# Patient Record
Sex: Male | Born: 1935 | Race: White | Hispanic: No | Marital: Single | Smoking: Former smoker
Health system: Southern US, Community
[De-identification: ages and names within clinical notes are randomized; demographics above are authoritative.]

## PROBLEM LIST (undated history)

## (undated) DIAGNOSIS — I10 Essential (primary) hypertension: Secondary | ICD-10-CM

## (undated) DIAGNOSIS — H3530 Unspecified macular degeneration: Secondary | ICD-10-CM

## (undated) HISTORY — PX: COLON SURGERY: SHX602

---

## 2006-08-01 ENCOUNTER — Emergency Department (HOSPITAL_COMMUNITY): Admission: EM | Admit: 2006-08-01 | Discharge: 2006-08-01 | Payer: Self-pay | Admitting: Emergency Medicine

## 2008-07-17 ENCOUNTER — Ambulatory Visit: Payer: Self-pay | Admitting: Internal Medicine

## 2009-01-08 ENCOUNTER — Ambulatory Visit: Payer: Self-pay | Admitting: Internal Medicine

## 2010-05-27 ENCOUNTER — Ambulatory Visit: Payer: Self-pay | Admitting: Internal Medicine

## 2011-03-04 ENCOUNTER — Other Ambulatory Visit: Payer: Self-pay

## 2011-03-04 MED ORDER — LISINOPRIL 20 MG PO TABS: 20.0000 mg | ORAL_TABLET | Freq: Every day | ORAL | Status: DC

## 2011-04-19 ENCOUNTER — Other Ambulatory Visit: Payer: Self-pay | Admitting: Internal Medicine

## 2011-05-12 ENCOUNTER — Ambulatory Visit (INDEPENDENT_AMBULATORY_CARE_PROVIDER_SITE_OTHER): Payer: Medicare Other | Admitting: Internal Medicine

## 2011-05-12 DIAGNOSIS — Z23 Encounter for immunization: Secondary | ICD-10-CM

## 2011-05-22 ENCOUNTER — Other Ambulatory Visit: Payer: Self-pay | Admitting: Internal Medicine

## 2011-06-21 ENCOUNTER — Other Ambulatory Visit: Payer: Self-pay | Admitting: Internal Medicine

## 2011-06-22 ENCOUNTER — Other Ambulatory Visit: Payer: Self-pay | Admitting: Internal Medicine

## 2011-06-23 ENCOUNTER — Other Ambulatory Visit: Payer: Self-pay | Admitting: Internal Medicine

## 2011-06-26 ENCOUNTER — Other Ambulatory Visit: Payer: Self-pay | Admitting: Internal Medicine

## 2011-07-21 ENCOUNTER — Other Ambulatory Visit: Payer: Self-pay | Admitting: Internal Medicine

## 2011-08-20 ENCOUNTER — Other Ambulatory Visit: Payer: Self-pay | Admitting: Internal Medicine

## 2011-08-28 ENCOUNTER — Other Ambulatory Visit: Payer: Self-pay | Admitting: Internal Medicine

## 2011-09-20 ENCOUNTER — Other Ambulatory Visit: Payer: Self-pay | Admitting: Internal Medicine

## 2011-09-20 NOTE — Telephone Encounter (Signed)
Is he due for OV/PE anytime soon?

## 2011-10-07 ENCOUNTER — Other Ambulatory Visit: Payer: Self-pay | Admitting: Internal Medicine

## 2011-10-07 NOTE — Telephone Encounter (Signed)
Please refill and see when appt is due.

## 2011-10-23 ENCOUNTER — Other Ambulatory Visit: Payer: Self-pay | Admitting: Internal Medicine

## 2011-11-14 ENCOUNTER — Other Ambulatory Visit: Payer: Self-pay | Admitting: Internal Medicine

## 2011-12-13 ENCOUNTER — Emergency Department (HOSPITAL_COMMUNITY): Payer: Medicare Other

## 2011-12-13 ENCOUNTER — Encounter (HOSPITAL_COMMUNITY): Payer: Self-pay | Admitting: Emergency Medicine

## 2011-12-13 ENCOUNTER — Emergency Department (HOSPITAL_COMMUNITY)
Admission: EM | Admit: 2011-12-13 | Discharge: 2011-12-13 | Disposition: A | Discharge status: Home / Self Care | Payer: Medicare Other | Attending: Emergency Medicine | Admitting: Emergency Medicine

## 2011-12-13 DIAGNOSIS — Z79899 Other long term (current) drug therapy: Secondary | ICD-10-CM | POA: Insufficient documentation

## 2011-12-13 DIAGNOSIS — I10 Essential (primary) hypertension: Secondary | ICD-10-CM | POA: Insufficient documentation

## 2011-12-13 DIAGNOSIS — R112 Nausea with vomiting, unspecified: Secondary | ICD-10-CM | POA: Insufficient documentation

## 2011-12-13 DIAGNOSIS — R5381 Other malaise: Secondary | ICD-10-CM | POA: Insufficient documentation

## 2011-12-13 DIAGNOSIS — R42 Dizziness and giddiness: Secondary | ICD-10-CM | POA: Insufficient documentation

## 2011-12-13 DIAGNOSIS — Z7982 Long term (current) use of aspirin: Secondary | ICD-10-CM | POA: Insufficient documentation

## 2011-12-13 HISTORY — DX: Essential (primary) hypertension: I10

## 2011-12-13 HISTORY — DX: Unspecified macular degeneration: H35.30

## 2011-12-13 LAB — POCT I-STAT, CHEM 8
Creatinine, Ser: 1.2 mg/dL (ref 0.50–1.35)
Glucose, Bld: 110 mg/dL — ABNORMAL HIGH (ref 70–99)
Hemoglobin: 13.6 g/dL (ref 13.0–17.0)
Sodium: 140 mEq/L (ref 135–145)
TCO2: 21 mmol/L (ref 0–100)

## 2011-12-13 MED ORDER — SODIUM CHLORIDE 0.9 % IV BOLUS (SEPSIS)
1000.0000 mL | Freq: Once | INTRAVENOUS | Status: AC
  Administered 2011-12-13: 1000 mL via INTRAVENOUS

## 2011-12-13 MED ORDER — MECLIZINE HCL 25 MG PO TABS: 25.0000 mg | ORAL_TABLET | Freq: Three times a day (TID) | ORAL | Status: AC | PRN

## 2011-12-13 MED ORDER — MECLIZINE HCL 25 MG PO TABS
25.0000 mg | ORAL_TABLET | Freq: Once | ORAL | Status: AC
  Administered 2011-12-13: 25 mg via ORAL
  Filled 2011-12-13: qty 1

## 2011-12-13 NOTE — ED Provider Notes (Signed)
History     CSN: [id number]  Arrival date & time 12/13/11  1705   First MD Initiated Contact with Patient 12/13/11 1742      Chief Complaint  Patient presents with  . Dizziness    The history is provided by the patient.   the patient reports he woke up approximately 1 AM with dizziness and then slept the remainder of the day when he awoke he was still dizzy.  At one point he believes it felt like there was spinning round and round but at other times he felt like he had more of a balance issue.  He became nauseated and vomited one time.  He's had no decrease oral intake.  He denies melena and hematochezia.  He's had no chest pain or palpitations.  He denies abdominal pain.  He has no headache at this time.  He denies weakness of his upper lower extremities.  Family reports no facial droop.  Family reports baseline mental status.  The patient has never had symptoms like this before.  Patient reports no history of stroke.  Past Medical History  Diagnosis Date  . Hypertension   . Macular degeneration of both eyes     Past Surgical History  Procedure Date  . Colon surgery     No family history on file.  History  Substance Use Topics  . Smoking status: Unknown If Ever Smoked  . Smokeless tobacco: Not on file  . Alcohol Use: No      Review of Systems  All other systems reviewed and are negative.    Allergies  Review of patient's allergies indicates no known allergies.  Home Medications   Current Outpatient Rx  Name Route Sig Dispense Refill  . AMLODIPINE BESYLATE 5 MG PO TABS  TAKE 1 TABLET BY MOUTH EVERY DAY 30 tablet 0  . ASPIRIN EC 81 MG PO TBEC Oral Take 81 mg by mouth daily.    Marland Kitchen LISINOPRIL 20 MG PO TABS  TAKE 1 TABLET BY MOUTH EVERY DAY 30 tablet 0  . MECLIZINE HCL 25 MG PO TABS Oral Take 1 tablet (25 mg total) by mouth 3 (three) times daily as needed. 30 tablet 0    BP 143/77  Pulse 75  Temp(Src) 98.1 F (36.7 C) (Oral)  Resp 16  SpO2 99%  Physical Exam    Nursing note and vitals reviewed. Constitutional: He is oriented to person, place, and time. He appears well-developed and well-nourished.  HENT:  Head: Normocephalic and atraumatic.  Eyes: EOM are normal. Pupils are equal, round, and reactive to light.  Neck: Normal range of motion.  Cardiovascular: Normal rate, regular rhythm, normal heart sounds and intact distal pulses.   Pulmonary/Chest: Effort normal and breath sounds normal. No respiratory distress.  Abdominal: Soft. He exhibits no distension. There is no tenderness.  Musculoskeletal: Normal range of motion.  Neurological: He is alert and oriented to person, place, and time.       5/5 strength in major muscle groups of  bilateral upper and lower extremities. Speech normal. No facial asymetry.  Normal finger to nose bilaterally.  Gait not tested  Skin: Skin is warm and dry.  Psychiatric: He has a normal mood and affect. Judgment normal.    ED Course  Procedures (including critical care time)  Labs Reviewed  POCT I-STAT, CHEM 8 - Abnormal; Notable for the following:    Glucose, Bld 110 (*)    All other components within normal limits   Mr Brain Wo  Contrast  12/13/2011  *RADIOLOGY REPORT*  Clinical Data: Hypertension with dizziness and weakness and one episode of vomiting.  MRI HEAD WITHOUT CONTRAST  Technique:  Multiplanar, multiecho pulse sequences of the brain and surrounding structures were obtained according to standard protocol without intravenous contrast.  Comparison: None.  Findings: No acute infarct.  Hemorrhagic breakdown products probably related to remote infarct left lenticular nucleus/caudate.  Tiny area of blood breakdown products cerebellum more notable on the right suggestive of result of prior hemorrhagic ischemia.  Moderate to small vessel disease type changes.  Global atrophy without hydrocephalus.  1.3 x 1 x 0.9 cm bilobed right-sided pituitary mass with suprasellar extension causing slight impression upon the  undersurface of the right optic nerve.  No other intracranial mass lesion noted on this unenhanced exam.  Mild spinal stenosis of the cervical spine.  Mild transverse ligament hypertrophy.  Major intracranial vascular structures are patent.  IMPRESSION: No acute infarct.  Hemorrhagic breakdown products probably related to remote infarct left lenticular nucleus/caudate.  Tiny area of blood breakdown products cerebellum more notable on the right suggestive of result of prior hemorrhagic ischemia.  Moderate to small vessel disease type changes.  Global atrophy without hydrocephalus.  1.3 x 1 x 0.9 cm bilobed right-sided pituitary mass with suprasellar extension causing slight impression upon the undersurface of the right optic nerve.  Original Report Authenticated By: Fuller Canada, M.D.     1. Dizziness       MDM  His MRI demonstrates no evidence of acute infarct.  Use argon 81 mg aspirin.  Likely had strokes before in the past.  This may represent peripheral vertigo for which I prescribed Antivert.  The patient feels much better at this time.  UA discharged home in good condition with PCP followup.        Lyanne Co, MD 12/13/11 [phone]

## 2011-12-13 NOTE — ED Notes (Signed)
Pt. Just return from [facility] was not performed

## 2011-12-13 NOTE — ED Notes (Signed)
Lab called to say that blood had never been received.

## 2011-12-13 NOTE — ED Notes (Signed)
Per EMS, woke up at 1 am with dizziness-took a nap this afternoon and same thing happened-nauseated with one vomiting episode on route to hospital

## 2011-12-13 NOTE — Discharge Instructions (Signed)

## 2011-12-13 NOTE — ED Notes (Signed)
Patient transported to CT unable to complete EKG at this time

## 2011-12-13 NOTE — ED Notes (Signed)
Off floor for testing 

## 2011-12-13 NOTE — ED Notes (Signed)
[id number]<AV> Expected date:<BR> Expected time: 4:54 PM<BR> Means of arrival:<BR> Comments:<BR> M10 - 87yoM dizzy

## 2011-12-20 ENCOUNTER — Other Ambulatory Visit: Payer: Self-pay | Admitting: Internal Medicine

## 2011-12-20 NOTE — Telephone Encounter (Signed)
Needs OV.  

## 2012-01-23 ENCOUNTER — Other Ambulatory Visit: Payer: Medicare Other | Admitting: Internal Medicine

## 2012-01-24 ENCOUNTER — Encounter: Payer: Medicare Other | Admitting: Internal Medicine

## 2012-01-25 ENCOUNTER — Other Ambulatory Visit: Payer: Self-pay | Admitting: Internal Medicine

## 2012-02-06 ENCOUNTER — Other Ambulatory Visit: Payer: Self-pay | Admitting: Internal Medicine

## 2012-03-05 ENCOUNTER — Other Ambulatory Visit: Payer: Medicare Other | Admitting: Internal Medicine

## 2012-03-06 ENCOUNTER — Encounter: Payer: Medicare Other | Admitting: Internal Medicine

## 2012-03-08 ENCOUNTER — Other Ambulatory Visit: Payer: Self-pay | Admitting: Internal Medicine

## 2012-04-08 ENCOUNTER — Other Ambulatory Visit: Payer: Self-pay | Admitting: Internal Medicine

## 2012-05-01 ENCOUNTER — Other Ambulatory Visit: Payer: Medicare Other | Admitting: Internal Medicine

## 2012-05-03 ENCOUNTER — Encounter: Payer: Medicare Other | Admitting: Internal Medicine

## 2012-05-16 ENCOUNTER — Other Ambulatory Visit: Payer: Self-pay | Admitting: Internal Medicine

## 2012-06-07 ENCOUNTER — Other Ambulatory Visit: Payer: Self-pay | Admitting: Internal Medicine

## 2012-06-21 ENCOUNTER — Other Ambulatory Visit: Payer: Medicare Other | Admitting: Internal Medicine

## 2012-06-21 ENCOUNTER — Ambulatory Visit (INDEPENDENT_AMBULATORY_CARE_PROVIDER_SITE_OTHER): Payer: Medicare Other | Admitting: Internal Medicine

## 2012-06-21 ENCOUNTER — Encounter: Payer: Self-pay | Admitting: Internal Medicine

## 2012-06-21 VITALS — BP 126/78 | HR 92 | Temp 97.7°F | Ht 67.5 in | Wt 155.0 lb

## 2012-06-21 DIAGNOSIS — Z0000 Encounter for general adult medical examination without abnormal findings: Secondary | ICD-10-CM

## 2012-06-21 DIAGNOSIS — I10 Essential (primary) hypertension: Secondary | ICD-10-CM

## 2012-06-21 DIAGNOSIS — H3530 Unspecified macular degeneration: Secondary | ICD-10-CM

## 2012-06-21 DIAGNOSIS — E237 Disorder of pituitary gland, unspecified: Secondary | ICD-10-CM

## 2012-06-21 DIAGNOSIS — M1712 Unilateral primary osteoarthritis, left knee: Secondary | ICD-10-CM

## 2012-06-21 DIAGNOSIS — M1990 Unspecified osteoarthritis, unspecified site: Secondary | ICD-10-CM

## 2012-06-21 DIAGNOSIS — I421 Obstructive hypertrophic cardiomyopathy: Secondary | ICD-10-CM

## 2012-06-21 DIAGNOSIS — H409 Unspecified glaucoma: Secondary | ICD-10-CM

## 2012-06-21 DIAGNOSIS — M4802 Spinal stenosis, cervical region: Secondary | ICD-10-CM

## 2012-06-21 DIAGNOSIS — Z23 Encounter for immunization: Secondary | ICD-10-CM

## 2012-06-21 DIAGNOSIS — C4440 Unspecified malignant neoplasm of skin of scalp and neck: Secondary | ICD-10-CM

## 2012-06-21 DIAGNOSIS — Z125 Encounter for screening for malignant neoplasm of prostate: Secondary | ICD-10-CM

## 2012-06-21 DIAGNOSIS — M1710 Unilateral primary osteoarthritis, unspecified knee: Secondary | ICD-10-CM

## 2012-06-21 DIAGNOSIS — E236 Other disorders of pituitary gland: Secondary | ICD-10-CM

## 2012-06-21 LAB — COMPREHENSIVE METABOLIC PANEL
ALT: 11 U/L (ref 0–53)
AST: 13 U/L (ref 0–37)
Alkaline Phosphatase: 62 U/L (ref 39–117)
Glucose, Bld: 86 mg/dL (ref 70–99)
Sodium: 139 mEq/L (ref 135–145)
Total Bilirubin: 1.2 mg/dL (ref 0.3–1.2)
Total Protein: 6.7 g/dL (ref 6.0–8.3)

## 2012-06-21 LAB — CBC WITH DIFFERENTIAL/PLATELET
Basophils Absolute: 0 10*3/uL (ref 0.0–0.1)
Basophils Relative: 0 % (ref 0–1)
Eosinophils Absolute: 0.1 10*3/uL (ref 0.0–0.7)
MCH: 31.4 pg (ref 26.0–34.0)
MCHC: 34.3 g/dL (ref 30.0–36.0)
Neutrophils Relative %: 57 % (ref 43–77)
Platelets: 258 10*3/uL (ref 150–400)
RDW: 14.2 % (ref 11.5–15.5)

## 2012-06-21 LAB — POCT URINALYSIS DIPSTICK
Protein, UA: NEGATIVE
Spec Grav, UA: >=1.03
Urobilinogen, UA: NEGATIVE

## 2012-06-21 LAB — LIPID PANEL
LDL Cholesterol: 92 mg/dL (ref 0–99)
Triglycerides: 81 mg/dL (ref <150)
VLDL: 16 mg/dL (ref 0–40)

## 2012-06-21 NOTE — Progress Notes (Signed)
Subjective:    Patient ID: Brandon Graves, male    DOB: 1924/05/28, 78 y.o.   MRN: [id number]  HPI Not seen since 2010. Had dizziness with one episode vomiting May 2013 and CT showed pituitary mass and remote cerebellar infarct with global atrophy. And settling vertigo. Responded to conservative treatment. With regard to pituitary mass, patient is asymptomatic and has had no further episodes. He is followed by Dr. Dione Booze for glaucoma. History of hypertension since 1993, IHSS diagnosed in 1996 seen by Dr. Patty Sermons and placed on Tenormin but this is been discontinued. He has done well. He has no known drug allergies. Had local reaction to Pneumovax immunization in 2001. History of macular degeneration of both eyes. Degenerative joint disease of left knee but refuses knee replacement. History of ventral hernia. Had an infected dog bite left forearm July 2001. Cataract extraction right eye February 2006. Had colonoscopy in 1992 and had complication of colonic perforation which was repaired. He had an incarcerated left inguinal hernia at that time. He currently is on Prinivil and amlodipine. Takes 81 mg of aspirin daily. Prefers conservative therapy.    Review of Systems  Constitutional: Positive for fatigue.  Eyes:       Macular degeneration both eyes  Respiratory: Negative.   Cardiovascular:       No chest pain  Gastrointestinal: Negative.   Endocrine: Negative.   Genitourinary: Negative.   Skin:       History of skin cancer on scalp treated by Dr. Park Liter  Allergic/Immunologic: Negative.   Neurological:       No vomiting or headaches or visual disturbances  Hematological: Negative.   Psychiatric/Behavioral: Negative.        Objective:   Physical Exam  Vitals reviewed. Constitutional: He is oriented to person, place, and time. He appears well-developed.  tkin white elderly male  HENT:  Head: Normocephalic and atraumatic.  Right Ear: External ear normal.  Left Ear: External ear  normal.  Mouth/Throat: Oropharynx is clear and moist.  Poor dentition  Eyes: Conjunctivae and EOM are normal. Pupils are equal, round, and reactive to light. Right eye exhibits no discharge. Left eye exhibits no discharge. No scleral icterus.  Neck: Neck supple. No JVD present. No thyromegaly present.  Cardiovascular: Normal rate, regular rhythm and intact distal pulses.   Murmur heard. II/VI systolic ejection murmur  Abdominal: Soft. Bowel sounds are normal. He exhibits no distension and no mass. There is no rebound and no guarding.  Genitourinary: Prostate normal.  Musculoskeletal: He exhibits no edema.  Crepitus left knee  Lymphadenopathy:    He has no cervical adenopathy.  Neurological: He is alert and oriented to person, place, and time. He has normal reflexes. No cranial nerve deficit. Coordination normal.  Skin: Skin is warm and dry. No rash noted.  Psychiatric: He has a normal mood and affect. His behavior is normal. Judgment and thought content normal.          Assessment & Plan:  Osteoarthritis left knee-refuses knee replacement  Hypertension-stable  History of remote infarct on MRI 2013 lenticular/caudate  History of IHHS-previously seen by Dr. Patty Sermons and Dr. [place]. Tenormin was discontinued in 2008 after episode of near syncope thought to be related to bradycardia.  History of vertigo May 2013 with MRI showing pituitary mass that is asymptomatic  History of skin cancer scalp  Plan: Return in 6 months for office visit, blood pressure check and further evaluation if necessary. Patient is to be seen on a every 6  month basis. Subjective:   Patient presents for Medicare Annual/Subsequent preventive examination.   Review Past Medical/Family/Social:   Risk Factors  Current exercise habits: sedentary Dietary issues discussed:  Appetite decreased  Cardiac risk factors: HTN  Depression Screen  (Note: if answer to either of the following is "Yes", a more  complete depression screening is indicated)   Over the past two weeks, have you felt down, depressed or hopeless? No  Over the past two weeks, have you felt little interest or pleasure in doing things? No Have you lost interest or pleasure in daily life? No Do you often feel hopeless? No Do you cry easily over simple problems? No   Activities of Daily Living  In your present state of health, do you have any difficulty performing the following activities?:   Driving? No  Managing money? No  Feeding yourself? No  Getting from bed to chair? Yes uses walker because of weakness in knees Climbing a flight of stairs? yes Preparing food and eating?: eats very little Bathing or showering? neesds assist Getting dressed: No  Getting to the toilet? No  Using the toilet:No  Moving around from place to place: uses walker In the past year have you fallen or had a near fall?:No  Are you sexually active? No  Do you have more than one partner? No   Hearing Difficulties: yes Do you often ask people to speak up or repeat themselves? yes Do you experience ringing or noises in your ears? yes Do you have difficulty understanding soft or whispered voices? yes Do you feel that you have a problem with memory? yes Do you often misplace items? yes   Home Safety:  Do you have a smoke alarm at your residence? Yes Do you have grab bars in the bathroom? yes Do you have throw rugs in your house? no   Cognitive Testing  Alert? Yes Normal Appearance?Yes  Oriented to person? Yes Place? Yes  Time? Yes  Recall of three objects? Yes  Can perform simple calculations? Yes  Displays appropriate judgment?Yes  Can read the correct time from a watch face?Yes   List the Names of Other Physician/Practitioners you currently use:  See referral list for the physicians patient is currently seeing.  Dr. Dione Booze    Review of Systems:   Objective:     General appearance: Appears stated age and mildly obese    Head: Normocephalic, without obvious abnormality, atraumatic  Eyes: conj clear, EOMi PEERLA  Ears: normal TM's and external ear canals both ears  Nose: Nares normal. Septum midline. Mucosa normal. No drainage or sinus tenderness.  Throat: lips, mucosa, and tongue normal; teeth and gums normal  Neck: no adenopathy, no carotid bruit, no JVD, supple, symmetrical, trachea midline and thyroid not enlarged, symmetric, no tenderness/mass/nodules  No CVA tenderness.  Lungs: clear to auscultation bilaterally  Breasts: normal appearance, no masses or tenderness Heart: regular rate and rhythm, S1, S2 normal, 2/6 systolic ejection murmur Abdomen: soft, non-tender; bowel sounds normal; no masses, no organomegaly  Musculoskeletal: ROM normal in all joints, no crepitus, no deformity, Normal muscle strengthen. Back  is symmetric, no curvature. Skin: Skin color, texture, turgor normal. No rashes or lesions  Lymph nodes: Cervical, supraclavicular, and axillary nodes normal.  Neurologic: CN 2 -12 Normal, Normal symmetric reflexes. Normal coordination and gait  Psych: Alert & Oriented x 3, Mood appear stable.    Assessment:    Annual wellness medicare exam   Plan:    During the course of  the visit the patient was educated and counseled about appropriate screening and preventive services including:   Declines colonoscopy-- last in 1992 with complication of colon perforation Annual eye exam with hx glaucoma No pneumovax- local reaction last May 2001      Patient Instructions (the written plan) was given to the patient.  Medicare Attestation  I have personally reviewed:  The patient's medical and social history  Their use of alcohol, tobacco or illicit drugs  Their current medications and supplements  The patient's functional ability including ADLs,fall risks, home safety risks, cognitive, and hearing and visual impairment  Diet and physical activities  Evidence for depression or mood disorders   The patient's weight, height, BMI, and visual acuity have been recorded in the chart. I have made referrals, counseling, and provided education to the patient based on review of the above and I have provided the patient with a written personalized care plan for preventive services.

## 2012-10-13 DIAGNOSIS — M1712 Unilateral primary osteoarthritis, left knee: Secondary | ICD-10-CM | POA: Insufficient documentation

## 2012-10-13 DIAGNOSIS — M4802 Spinal stenosis, cervical region: Secondary | ICD-10-CM | POA: Insufficient documentation

## 2012-10-13 DIAGNOSIS — C4440 Unspecified malignant neoplasm of skin of scalp and neck: Secondary | ICD-10-CM | POA: Insufficient documentation

## 2012-10-13 DIAGNOSIS — I421 Obstructive hypertrophic cardiomyopathy: Secondary | ICD-10-CM | POA: Insufficient documentation

## 2012-10-13 DIAGNOSIS — H3530 Unspecified macular degeneration: Secondary | ICD-10-CM | POA: Insufficient documentation

## 2012-10-13 DIAGNOSIS — H409 Unspecified glaucoma: Secondary | ICD-10-CM | POA: Insufficient documentation

## 2012-10-13 DIAGNOSIS — E236 Other disorders of pituitary gland: Secondary | ICD-10-CM | POA: Insufficient documentation

## 2012-10-13 NOTE — Patient Instructions (Addendum)
Continue same medications and return in 6 months 

## 2012-12-20 ENCOUNTER — Ambulatory Visit: Payer: Medicare Other | Admitting: Internal Medicine

## 2013-01-10 ENCOUNTER — Ambulatory Visit: Payer: Self-pay | Admitting: Internal Medicine

## 2013-02-28 ENCOUNTER — Ambulatory Visit: Payer: Medicare Other | Admitting: Internal Medicine

## 2013-04-11 ENCOUNTER — Ambulatory Visit: Payer: Medicare Other | Admitting: Internal Medicine

## 2013-05-27 ENCOUNTER — Ambulatory Visit: Payer: Medicare Other | Admitting: Internal Medicine

## 2013-05-29 ENCOUNTER — Other Ambulatory Visit: Payer: Self-pay | Admitting: Internal Medicine

## 2013-06-20 ENCOUNTER — Ambulatory Visit (INDEPENDENT_AMBULATORY_CARE_PROVIDER_SITE_OTHER): Payer: Medicare Other | Admitting: Internal Medicine

## 2013-06-20 ENCOUNTER — Encounter: Payer: Self-pay | Admitting: Internal Medicine

## 2013-06-20 VITALS — BP 158/22 | HR 92 | Temp 97.8°F | Ht 67.5 in | Wt 160.0 lb

## 2013-06-20 DIAGNOSIS — I10 Essential (primary) hypertension: Secondary | ICD-10-CM

## 2013-06-20 DIAGNOSIS — H9193 Unspecified hearing loss, bilateral: Secondary | ICD-10-CM

## 2013-06-20 DIAGNOSIS — Z23 Encounter for immunization: Secondary | ICD-10-CM

## 2013-06-20 DIAGNOSIS — R7309 Other abnormal glucose: Secondary | ICD-10-CM

## 2013-06-20 DIAGNOSIS — I421 Obstructive hypertrophic cardiomyopathy: Secondary | ICD-10-CM

## 2013-06-20 DIAGNOSIS — H9190 Unspecified hearing loss, unspecified ear: Secondary | ICD-10-CM

## 2013-06-20 DIAGNOSIS — H3530 Unspecified macular degeneration: Secondary | ICD-10-CM

## 2013-06-20 DIAGNOSIS — R7302 Impaired glucose tolerance (oral): Secondary | ICD-10-CM

## 2013-06-20 MED ORDER — AMLODIPINE BESYLATE 5 MG PO TABS: ORAL_TABLET | ORAL | Status: DC

## 2013-06-20 MED ORDER — LISINOPRIL 20 MG PO TABS: ORAL_TABLET | ORAL | Status: DC

## 2013-06-20 NOTE — Patient Instructions (Addendum)
Watch diet. Continue same medications Return in 6 months. Tetanus immunization given today.

## 2013-10-05 NOTE — Progress Notes (Signed)
   Subjective:    Patient ID: Brandon Graves, male    DOB: [birth year], 80 y.o.   MRN: [id number]  HPI 80 year old white male with history of hypertrophic cardiomyopathy and hypertension in today for recheck. Has a history of osteoarthritis of his knees. He and his wife continued to live independently. History of hearing loss and macular degeneration. No longer drives.    Review of Systems     Objective:   Physical Exam dentition is poor. He is hard of hearing. Neck is supple without JVD thyromegaly or carotid bruits. Chest clear to auscultation. Cardiac exam regular rate and rhythm normal S1 and S2. Extremities without pitting edema.        Assessment & Plan:  Hypertension  History of hypertrophic cardiomyopathy  Osteoarthritis of knees  Macular degeneration  Bilateral hearing loss  Plan: Return in 6 months for physical examination.

## 2014-01-03 ENCOUNTER — Encounter: Payer: PRIVATE HEALTH INSURANCE | Admitting: Internal Medicine

## 2014-02-28 ENCOUNTER — Encounter: Payer: Self-pay | Admitting: Internal Medicine

## 2014-02-28 ENCOUNTER — Ambulatory Visit (INDEPENDENT_AMBULATORY_CARE_PROVIDER_SITE_OTHER): Payer: Medicare Other | Admitting: Internal Medicine

## 2014-02-28 VITALS — BP 136/86 | HR 84 | Temp 97.8°F | Ht 66.0 in | Wt 161.0 lb

## 2014-02-28 DIAGNOSIS — M4802 Spinal stenosis, cervical region: Secondary | ICD-10-CM

## 2014-02-28 DIAGNOSIS — I421 Obstructive hypertrophic cardiomyopathy: Secondary | ICD-10-CM

## 2014-02-28 DIAGNOSIS — Z8679 Personal history of other diseases of the circulatory system: Secondary | ICD-10-CM

## 2014-02-28 DIAGNOSIS — M1710 Unilateral primary osteoarthritis, unspecified knee: Secondary | ICD-10-CM

## 2014-02-28 DIAGNOSIS — Z8669 Personal history of other diseases of the nervous system and sense organs: Secondary | ICD-10-CM

## 2014-02-28 DIAGNOSIS — H3530 Unspecified macular degeneration: Secondary | ICD-10-CM

## 2014-02-28 DIAGNOSIS — Z23 Encounter for immunization: Secondary | ICD-10-CM

## 2014-02-28 DIAGNOSIS — B351 Tinea unguium: Secondary | ICD-10-CM

## 2014-02-28 DIAGNOSIS — E236 Other disorders of pituitary gland: Secondary | ICD-10-CM

## 2014-02-28 DIAGNOSIS — Z8673 Personal history of transient ischemic attack (TIA), and cerebral infarction without residual deficits: Secondary | ICD-10-CM

## 2014-02-28 DIAGNOSIS — Z13220 Encounter for screening for lipoid disorders: Secondary | ICD-10-CM

## 2014-02-28 DIAGNOSIS — E237 Disorder of pituitary gland, unspecified: Secondary | ICD-10-CM

## 2014-02-28 DIAGNOSIS — Z0000 Encounter for general adult medical examination without abnormal findings: Secondary | ICD-10-CM

## 2014-02-28 DIAGNOSIS — I10 Essential (primary) hypertension: Secondary | ICD-10-CM

## 2014-02-28 DIAGNOSIS — M1712 Unilateral primary osteoarthritis, left knee: Secondary | ICD-10-CM

## 2014-02-28 DIAGNOSIS — Z125 Encounter for screening for malignant neoplasm of prostate: Secondary | ICD-10-CM

## 2014-02-28 DIAGNOSIS — H9190 Unspecified hearing loss, unspecified ear: Secondary | ICD-10-CM

## 2014-02-28 LAB — POCT URINALYSIS DIPSTICK
BILIRUBIN UA: NEGATIVE
GLUCOSE UA: NEGATIVE
Ketones, UA: NEGATIVE
Leukocytes, UA: NEGATIVE
NITRITE UA: NEGATIVE
Protein, UA: NEGATIVE
RBC UA: NEGATIVE
Spec Grav, UA: 1.015
UROBILINOGEN UA: NEGATIVE
pH, UA: 6

## 2014-02-28 LAB — CBC WITH DIFFERENTIAL/PLATELET
Basophils Absolute: 0.1 10*3/uL (ref 0.0–0.1)
Basophils Relative: 1 % (ref 0–1)
Eosinophils Absolute: 0.3 10*3/uL (ref 0.0–0.7)
Eosinophils Relative: 3 % (ref 0–5)
HCT: 39.4 % (ref 39.0–52.0)
HEMOGLOBIN: 13.3 g/dL (ref 13.0–17.0)
LYMPHS ABS: 2.1 10*3/uL (ref 0.7–4.0)
LYMPHS PCT: 24 % (ref 12–46)
MCH: 31.1 pg (ref 26.0–34.0)
MCHC: 33.8 g/dL (ref 30.0–36.0)
MCV: 92.1 fL (ref 78.0–100.0)
MONOS PCT: 15 % — AB (ref 3–12)
Monocytes Absolute: 1.3 10*3/uL — ABNORMAL HIGH (ref 0.1–1.0)
NEUTROS ABS: 5 10*3/uL (ref 1.7–7.7)
NEUTROS PCT: 57 % (ref 43–77)
PLATELETS: 274 10*3/uL (ref 150–400)
RBC: 4.28 MIL/uL (ref 4.22–5.81)
RDW: 14.5 % (ref 11.5–15.5)
WBC: 8.7 10*3/uL (ref 4.0–10.5)

## 2014-02-28 MED ORDER — PNEUMOCOCCAL 13-VAL CONJ VACC IM SUSP: 0.5000 mL | INTRAMUSCULAR | Status: DC

## 2014-02-28 NOTE — Patient Instructions (Addendum)
Continue same medications and return in one year. Prevnar given. See podiatrist regarding toenail fungus and have toenails trimmed

## 2014-03-01 LAB — COMPREHENSIVE METABOLIC PANEL
ALBUMIN: 4 g/dL (ref 3.5–5.2)
ALT: 10 U/L (ref 0–53)
AST: 13 U/L (ref 0–37)
Alkaline Phosphatase: 59 U/L (ref 39–117)
BILIRUBIN TOTAL: 1 mg/dL (ref 0.2–1.2)
BUN: 18 mg/dL (ref 6–23)
CHLORIDE: 104 meq/L (ref 96–112)
CO2: 25 meq/L (ref 19–32)
Calcium: 9 mg/dL (ref 8.4–10.5)
Creat: 1.25 mg/dL (ref 0.50–1.35)
GLUCOSE: 84 mg/dL (ref 70–99)
POTASSIUM: 4.2 meq/L (ref 3.5–5.3)
SODIUM: 139 meq/L (ref 135–145)
TOTAL PROTEIN: 7 g/dL (ref 6.0–8.3)

## 2014-03-01 LAB — PSA, MEDICARE: PSA: 1.52 ng/mL (ref <=4.00)

## 2014-03-01 LAB — LIPID PANEL
CHOLESTEROL: 150 mg/dL (ref 0–200)
HDL: 56 mg/dL (ref >39)
LDL Cholesterol: 82 mg/dL (ref 0–99)
Total CHOL/HDL Ratio: 2.7 Ratio
Triglycerides: 58 mg/dL (ref <150)
VLDL: 12 mg/dL (ref 0–40)

## 2014-04-30 ENCOUNTER — Encounter: Payer: Self-pay | Admitting: Internal Medicine

## 2014-04-30 NOTE — Progress Notes (Signed)
Subjective:    Patient ID: Brandon Graves, male    DOB: [birth year], 80 y.o.   MRN: [id number]  HPI  80 year old White Male in today for health maintenance and evaluation of medical issues. History of hypertension since 1993. Hypertrophic cardiomyopathy diagnosed in 1996 by Dr. Mare Ferrari and placed on Tenormin but this is been discontinued. He has done well. In May 2013 he had an episode of dizziness and vomiting. CT showed pituitary mass and a remote cerebella infarct with global atrophy. He responded to conservative treatment. With regard to pituitary mass, patient is asymptomatic and has had no further episodes. He is followed by Dr. Katy Fitch for glaucoma.  No known drug allergies  Had local reaction to Pneumovax immunization in 2001  History of macular degeneration of both eyes. Degenerative joint disease of left knee but refuses knee replacement. History of ventral hernia. Had infected dog bite of left forearm July 2001 cataract extraction of right February 2006. He had colonoscopy in 0160 and had complication of colonic perforation which was repaired. He had an incarcerated left inguinal hernia at that time. He has refused further colonoscopies. Currently on Prinivil and amlodipine and takes 81 mg of aspirin daily. History of skin cancer on the scalp treated by Dr. Link Snuffer.    Review of Systems  Constitutional: Negative.   HENT:       Hearing loss  Eyes:       History of macular degeneration  Cardiovascular: Negative for chest pain and leg swelling.  Gastrointestinal: Negative.   Genitourinary: Negative.   Psychiatric/Behavioral: Negative.        Objective:   Physical Exam  Vitals reviewed. Constitutional: He appears well-nourished. No distress.  HENT:  Head: Normocephalic and atraumatic.  Right Ear: External ear normal.  Left Ear: External ear normal.  Mouth/Throat: Oropharynx is clear and moist. No oropharyngeal exudate.  Dentition poor  Eyes: Right eye exhibits no  discharge. Left eye exhibits no discharge. No scleral icterus.  Neck: Neck supple. No JVD present. No thyromegaly present.  Cardiovascular: Normal rate and regular rhythm.   Murmur heard. 2/6 systolic ejection murmur  Pulmonary/Chest: Effort normal and breath sounds normal. No respiratory distress. He has no wheezes. He has no rales. He exhibits no tenderness.  Abdominal: Soft. Bowel sounds are normal. He exhibits no distension. There is no tenderness. There is no rebound and no guarding.  Musculoskeletal: He exhibits no edema.  Crepitus left knee  Lymphadenopathy:    He has no cervical adenopathy.  Neurological: He is alert. He has normal reflexes. No cranial nerve deficit.  Uses walker or wheelchair  Skin: Skin is warm and dry. No rash noted. He is not diaphoretic.          Assessment & Plan:  Osteoarthritis left knee-refuses knee replacement. Therefore has some issues with ambulation and has to use walker or wheelchair  Remote history of cerebellar infarct  History of hypertrophic cardiomyopathy. Tenormin was discontinued in 2008 after episode of near syncope thought to be related to bradycardia.  Hypertension-stable on 2 drug regimen  History of skin cancer of scalp  Hearing loss  Macular degeneration  History pituitary mass-asymptomatic  Onychomycosis  Plan: Return in 6 months and continue same medications. See podiatrist regarding toenail fungus and have toenail trimmed  Subjective:   Patient presents for Medicare Annual/Subsequent preventive examination.  Review Past Medical/Family/Social: See above   Risk Factors  Current exercise habits: Sedentary Dietary issues discussed: Wife reports he has little appetite. Dietary issues discussed  Cardiac risk factors: Hypertension  Depression Screen  (Note: if answer to either of the following is "Yes", a more complete depression screening is indicated)   Over the past two weeks, have you felt down, depressed or  hopeless? No  Over the past two weeks, have you felt little interest or pleasure in doing things? No Have you lost interest or pleasure in daily life? No Do you often feel hopeless? No Do you cry easily over simple problems? No   Activities of Daily Living  In your present state of health, do you have any difficulty performing the following activities?:   Driving? No longer drives due to macular degeneration Managing money? yes Feeding yourself? No  Getting from bed to chair? No  Climbing a flight of stairs? yes due to osteoarthritis Preparing food and eating?: No  Bathing or showering? No  Getting dressed: No  Getting to the toilet? No  Using the toilet:No  Moving around from place to place: No  In the past year have you fallen or had a near fall?:No  Are you sexually active? No  Do you have more than one partner? No   Hearing Difficulties: No  Do you often ask people to speak up or repeat themselves? yes Do you experience ringing or noises in your ears? No  Do you have difficulty understanding soft or whispered voices? yes Do you feel that you have a problem with memory? No Do you often misplace items? No    Home Safety:  Do you have a smoke alarm at your residence? Yes Do you have grab bars in the bathroom? yes Do you have throw rugs in your  no   Cognitive Testing  Alert? Yes Normal Appearance?Yes  Oriented to person? Yes Place? Yes  Time? Yes  Recall of three objects?  Can perform simple calculations?  Displays appropriate judgment?Yes  Can read the correct time from a watch face?Yes   List the Names of Other Physician/Practitioners you currently use:  See referral list for the physicians patient is currently seeing.  Dr. Katy Fitch for glaucoma   Review of Systems: See above   Objective:     General appearance: Appears stated age  Head: Normocephalic, without obvious abnormality, atraumatic  Eyes: conj clear, EOMi PEERLA  Ears: normal TM's and external  ear canals both ears  Nose: Nares normal. Septum midline. Mucosa normal. No drainage or sinus tenderness.  Throat: lips, mucosa, and tongue normal; dentition poor Neck: no adenopathy, no carotid bruit, no JVD, supple, symmetrical, trachea midline and thyroid not enlarged, symmetric, no tenderness/mass/nodules  No CVA tenderness.  Lungs: clear to auscultation bilaterally  Breasts: normal appearance, no masses or tenderness Heart: regular rate and rhythm, S1, S2 normal, 2/6 systolic ejection murmur, click, rub or gallop  Abdomen: soft, non-tender; bowel sounds normal; no masses, no organomegaly  Musculoskeletal: ROM normal in all joints, no crepitus, no deformity, Normal muscle strengthen. Back  is symmetric, no curvature. Skin: Skin color, texture, turgor normal. No rashes or lesions . Long toenails with onychomycosis Lymph nodes: Cervical, supraclavicular, and axillary nodes normal.  Neurologic: CN 2 -12 Normal, Normal symmetric reflexes. Normal coordination and gait  Psych: Alert & Oriented x 3, Mood appear stable.    Assessment:    Annual wellness medicare exam   Plan:    During the course of the visit the patient was educated and counseled about appropriate screening and preventive services including:   Prevnar  PSA     Patient Instructions (the  written plan) was given to the patient.  Medicare Attestation  I have personally reviewed:  The patient's medical and social history  Their use of alcohol, tobacco or illicit drugs  Their current medications and supplements  The patient's functional ability including ADLs,fall risks, home safety risks, cognitive, and hearing and visual impairment  Diet and physical activities  Evidence for depression or mood disorders  The patient's weight, height, BMI, and visual acuity have been recorded in the chart. I have made referrals, counseling, and provided education to the patient based on review of the above and I have provided the  patient with a written personalized care plan for preventive services.

## 2014-07-11 ENCOUNTER — Other Ambulatory Visit: Payer: Self-pay | Admitting: Internal Medicine

## 2015-02-19 ENCOUNTER — Emergency Department (HOSPITAL_COMMUNITY)
Admission: EM | Admit: 2015-02-19 | Discharge: 2015-02-19 | Disposition: A | Discharge status: Home / Self Care | Payer: Medicare Other | Attending: Emergency Medicine | Admitting: Emergency Medicine

## 2015-02-19 ENCOUNTER — Emergency Department (HOSPITAL_COMMUNITY): Payer: Medicare Other

## 2015-02-19 ENCOUNTER — Encounter (HOSPITAL_COMMUNITY): Payer: Self-pay

## 2015-02-19 DIAGNOSIS — I10 Essential (primary) hypertension: Secondary | ICD-10-CM | POA: Diagnosis not present

## 2015-02-19 DIAGNOSIS — M6281 Muscle weakness (generalized): Secondary | ICD-10-CM | POA: Diagnosis not present

## 2015-02-19 DIAGNOSIS — Z79899 Other long term (current) drug therapy: Secondary | ICD-10-CM | POA: Insufficient documentation

## 2015-02-19 DIAGNOSIS — Z87891 Personal history of nicotine dependence: Secondary | ICD-10-CM | POA: Diagnosis not present

## 2015-02-19 DIAGNOSIS — H913 Deaf nonspeaking, not elsewhere classified: Secondary | ICD-10-CM | POA: Insufficient documentation

## 2015-02-19 DIAGNOSIS — R29898 Other symptoms and signs involving the musculoskeletal system: Secondary | ICD-10-CM

## 2015-02-19 DIAGNOSIS — R5383 Other fatigue: Secondary | ICD-10-CM | POA: Diagnosis present

## 2015-02-19 DIAGNOSIS — Z8669 Personal history of other diseases of the nervous system and sense organs: Secondary | ICD-10-CM | POA: Diagnosis not present

## 2015-02-19 DIAGNOSIS — Z7982 Long term (current) use of aspirin: Secondary | ICD-10-CM | POA: Diagnosis not present

## 2015-02-19 LAB — URINALYSIS, ROUTINE W REFLEX MICROSCOPIC
Glucose, UA: NEGATIVE mg/dL
Ketones, ur: 15 mg/dL — AB
Leukocytes, UA: NEGATIVE
Nitrite: NEGATIVE
Protein, ur: NEGATIVE mg/dL
Specific Gravity, Urine: 1.026 (ref 1.005–1.030)
Urobilinogen, UA: 1 mg/dL (ref 0.0–1.0)
pH: 5 (ref 5.0–8.0)

## 2015-02-19 LAB — BASIC METABOLIC PANEL
Anion gap: 12 (ref 5–15)
BUN: 19 mg/dL (ref 6–20)
CO2: 19 mmol/L — ABNORMAL LOW (ref 22–32)
Calcium: 8.8 mg/dL — ABNORMAL LOW (ref 8.9–10.3)
Chloride: 104 mmol/L (ref 101–111)
Creatinine, Ser: 1.44 mg/dL — ABNORMAL HIGH (ref 0.61–1.24)
GFR calc Af Amer: 48 mL/min — ABNORMAL LOW (ref >60)
GFR calc non Af Amer: 42 mL/min — ABNORMAL LOW (ref >60)
Glucose, Bld: 100 mg/dL — ABNORMAL HIGH (ref 65–99)
Potassium: 4.4 mmol/L (ref 3.5–5.1)
Sodium: 135 mmol/L (ref 135–145)

## 2015-02-19 LAB — CBC WITH DIFFERENTIAL/PLATELET
Basophils Absolute: 0 10*3/uL (ref 0.0–0.1)
Basophils Relative: 0 % (ref 0–1)
Eosinophils Absolute: 0 10*3/uL (ref 0.0–0.7)
Eosinophils Relative: 0 % (ref 0–5)
HCT: 39.1 % (ref 39.0–52.0)
Hemoglobin: 13.5 g/dL (ref 13.0–17.0)
Lymphocytes Relative: 9 % — ABNORMAL LOW (ref 12–46)
Lymphs Abs: 1 10*3/uL (ref 0.7–4.0)
MCH: 30.3 pg (ref 26.0–34.0)
MCHC: 34.5 g/dL (ref 30.0–36.0)
MCV: 87.9 fL (ref 78.0–100.0)
Monocytes Absolute: 1 10*3/uL (ref 0.1–1.0)
Monocytes Relative: 9 % (ref 3–12)
Neutro Abs: 8.9 10*3/uL — ABNORMAL HIGH (ref 1.7–7.7)
Neutrophils Relative %: 81 % — ABNORMAL HIGH (ref 43–77)
Platelets: 305 10*3/uL (ref 150–400)
RBC: 4.45 MIL/uL (ref 4.22–5.81)
RDW: 13.3 % (ref 11.5–15.5)
WBC: 10.9 10*3/uL — ABNORMAL HIGH (ref 4.0–10.5)

## 2015-02-19 LAB — URINE MICROSCOPIC-ADD ON

## 2015-02-19 LAB — I-STAT CG4 LACTIC ACID, ED: Lactic Acid, Venous: 1.12 mmol/L (ref 0.5–2.0)

## 2015-02-19 MED ORDER — SODIUM CHLORIDE 0.9 % IV BOLUS (SEPSIS)
500.0000 mL | Freq: Once | INTRAVENOUS | Status: AC
  Administered 2015-02-19: 500 mL via INTRAVENOUS

## 2015-02-19 NOTE — ED Notes (Signed)
Contacted PT for consult.

## 2015-02-19 NOTE — Care Management Note (Signed)
Case Management Note  Patient Details  Name: Brandon Graves MRN: [id number] Date of Birth: June 05, 1924  Subjective/Objective:                  81 year old male brought in by family for evaluation of weakness.//Home with spouse.  Action/Plan: Access for disposition needs.  Expected Discharge Date:          02/19/15        Expected Discharge Plan:  [place]  In-House Referral:  Clinical Social Work  Discharge planning Services  CM Consult  Post Acute Care Choice:  Durable Medical Equipment, Home Health Choice offered to:  Spouse, Adult Children  DME Arranged:  Bedside commode, Hospital bed, Wheelchair manual DME Agency:  [place]:  RN, PT, Nurse's Aide, Disease Management [place] Agency:  [place]  Status of Service:  Completed, signed off  Medicare Important Message Given:    Date Medicare IM Given:    Medicare IM give by:    Date Additional Medicare IM Given:    Additional Medicare Important Message give by:     If discussed at [place] of Stay Meetings, dates discussed:    Additional Comments: [name] [name], RN, BSN, [employer] [phone] Spoke with pt at bedside regarding discharge planning for [facility]. Offered pt list of home health agencies to choose from.  Pt chose [facility] to render services. Santiago Glad of [facility] notified.   DME needs identified at this time include bedside commode, wheelchair and hospital bed.  NCM spoke to pt nurse Jerene Pitch, RN to update on status and need for face to face from EDP prior to pt discharge home today.  Fuller Mandril, RN 02/19/2015, 2:39 PM

## 2015-02-19 NOTE — Evaluation (Signed)
Physical Therapy Evaluation Patient Details Name: Brandon Graves MRN: [id number] DOB: 04-Jul-1924 Today's Date: 02/19/2015   History of Present Illness  81 year old male brought in by family for evaluation of weakness. Patient typically ambulates with a walker. He has been getting around much slower over the past week. Today he was unable to get up from his reclining chair which he sleeps in. He has not voiced any specific complaints to his family recently. CT of head negative  Clinical Impression  Pt sleeping on arrival, easily arousable and disoriented to date. Pt with generalized weakness and decreased ability with transfers and gait maintaining crouched posture. Pt reports he feels no different than normal. Spouse is primary caregiver with assist of children who work but states she cannot care for pt with his increasing weakness and will need additional help at home but would be open to SNF for rehab. Pt will benefit from acute therapy to maximize strength, mobility, transfers and gait to decrease burden of care and address above and below deficits.     Follow Up Recommendations SNF;Supervision for mobility/OOB (SNF . If family chooses to return home will need HHPT and aide)    Equipment Recommendations  3in1 (PT);Wheelchair (measurements PT);Wheelchair cushion (measurements PT);Hospital bed;Other (comment) (bed pan)    Recommendations for Other Services       Precautions / Restrictions Precautions Precautions: Fall      Mobility  Bed Mobility Overal bed mobility: Needs Assistance Bed Mobility: Rolling;Sidelying to Sit Rolling: Min assist Sidelying to sit: Mod assist       General bed mobility comments: max cues as pt does not sleep in bed at home, assist to rotate pelvis, elevate trunk and bring legs off of surface. Total assist to scoot to [facility] with draw sheet  Transfers Overall transfer level: Needs assistance Equipment used: Rolling walker (2 wheeled) Transfers: Sit  to/from Stand Sit to Stand: Min assist         General transfer comment: cues for hand placement and safety, pt sitting as soon as he is near surface despite max cues and need to be closer to [facility]  Ambulation/Gait Ambulation/Gait assistance: Min assist Ambulation Distance (Feet): 20 Feet Assistive device: Rolling walker (2 wheeled) Gait Pattern/deviations: Shuffle;Trunk flexed;Narrow base of support   Gait velocity interpretation: Below normal speed for age/gender General Gait Details: pt maintaining crouched gait throughout with increased flexion as pt fatigued with gait. Max cues for upright posture, position in RW, safety and assist to steer and direct RW with very limited distance possible  Stairs            Wheelchair Mobility    Modified Rankin (Stroke Patients Only)       Balance Overall balance assessment: Needs assistance   Sitting balance-Leahy Scale: Fair Sitting balance - Comments: anterior lean with flexion with cues for posture and stability, EOB 4 min   Standing balance support: Bilateral upper extremity supported Standing balance-Leahy Scale: Poor                               Pertinent Vitals/Pain Pain Assessment: No/denies pain    Home Living Family/patient expects to be discharged to:: Private residence Living Arrangements: Spouse/significant other Available Help at Discharge: Family;Available 24 hours/day Type of Home: House Home Access: Stairs to enter   [employer] of Steps: 3 Home Layout: One level Home Equipment: Walker - 4 wheels;Other (comment);Grab bars - toilet;Shower seat (lift chair)  Prior Function Level of Independence: Needs assistance   Gait / Transfers Assistance Needed: pt normally can transfer out of his lift chair to bathroom and toilet on his own grossly 20'. pt has not been in bed for 2 years  ADL's / Homemaking Assistance Needed: wife sponge bathes and assists with dresses him.          Hand Dominance        Extremity/Trunk Assessment   Upper Extremity Assessment: Generalized weakness           Lower Extremity Assessment: Generalized weakness      Cervical / Trunk Assessment: Kyphotic  Communication   Communication: HOH  Cognition Arousal/Alertness: Awake/alert Behavior During Therapy: Flat affect Overall Cognitive Status: Impaired/Different from baseline Area of Impairment: Following commands;Safety/judgement     Memory: Decreased short-term memory Following Commands: Follows one step commands with increased time Safety/Judgement: Decreased awareness of deficits;Decreased awareness of safety          General Comments      Exercises        Assessment/Plan    PT Assessment Patient needs continued PT services  PT Diagnosis Difficulty walking;Generalized weakness;Altered mental status   PT Problem List Decreased strength;Decreased cognition;Decreased activity tolerance;Decreased safety awareness;Decreased knowledge of use of DME;Decreased balance;Decreased mobility;Decreased coordination  PT Treatment Interventions Gait training;DME instruction;Balance training;Neuromuscular re-education;Cognitive remediation;Functional mobility training;Patient/family education;Therapeutic activities;Therapeutic exercise   PT Goals (Current goals can be found in the Care Plan section) Acute Rehab PT Goals Patient Stated Goal: return to walkign to the bathroom PT Goal Formulation: With family Time For Goal Achievement: 03/05/15 Potential to Achieve Goals: Fair    Frequency Min 3X/week   Barriers to discharge Decreased caregiver support      Co-evaluation               End of Session Equipment Utilized During Treatment: Gait belt Activity Tolerance: Patient tolerated treatment well Patient left: in bed;with call bell/phone within reach;with family/visitor present Nurse Communication: Mobility status    Functional Assessment Tool Used:  clinical judgement Functional Limitation: Mobility: Walking and moving around Mobility: Walking and Moving Around Current Status [phone]): At least 40 percent but less than 60 percent impaired, limited or restricted Mobility: Walking and Moving Around Goal Status [phone]): At least 1 percent but less than 20 percent impaired, limited or restricted    Time: 1347-1414 PT Time Calculation (min) (ACUTE ONLY): 27 min   Charges:   PT Evaluation $Initial PT Evaluation Tier I: 1 Procedure PT Treatments $Therapeutic Activity: 8-22 mins   PT G Codes:   PT G-Codes **NOT FOR INPATIENT CLASS** Functional Assessment Tool Used: clinical judgement Functional Limitation: Mobility: Walking and moving around Mobility: Walking and Moving Around Current Status ([id number]): At least 40 percent but less than 60 percent impaired, limited or restricted Mobility: Walking and Moving Around Goal Status [phone]): At least 1 percent but less than 20 percent impaired, limited or restricted    Melford Aase 02/19/2015, 2:49 PM Elwyn Reach, [place]

## 2015-02-19 NOTE — Clinical Social Work Note (Signed)
Clinical Social Work Assessment  Patient Details  Name: Brandon Graves MRN: [id number] Date of Birth: 1924/04/19  Date of referral:  02/19/15               Reason for consult:  Facility Placement                Permission sought to share information with:  Case Manager, Customer service manager, Family Supports Permission granted to share information::  Yes, Verbal Permission Granted  Name::        Agency::  [facility]NF[facility]  Relationship::  patient wife: June and son at the bedside   Contact Information:     Housing/Transportation Living arrangements for the past 2 months:  Single Family Home Source of Information:  Medical Team, Adult Children, Spouse Patient Interpreter Needed:  None Criminal Activity/Legal Involvement Pertinent to Current Situation/Hospitalization:  No - Comment as needed Significant Relationships:  Adult Children, Spouse Lives with:  Spouse Do you feel safe going back to the place where you live?  No (wife reports patient cannot use his legs, reports he is deconditioned) Need for family participation in patient care:  Yes (Comment) (family involved with care from the bedside)  Care giving concerns:  Per patient wife, patient has been doing well with her in the home and use of walker however recently he has become weaker and unable to use his legs. Per son and wife, patient walked 20-61f to the bathroom with walker.  Wife reports son helps with additional needs around the house, but up until this time patient and wife live independently and help one another. Wife feels today he is too much to care for and may need physical therapy in a SNF.   Social Worker assessment / plan:  LCSW received consult for placement. Assessed patient and met with family at the bedside. Patient is awake and alert, but unable to answer appropriate questions as he just groans and appears uncomfortable. Entire assessment completed with wife and son.  Wife reports family  lives in home and take care of one another. Reports son comes into home to assist as needed. Up until this time patient has been using a walker in the home, no current services in place such as home health.  Wife reports overall fatigue and deconditioning, but expresses several times patient is medically doing okay and there are no current MD concerns that would warrant his legs not working.    Discussed case with MD who also confirms no real medical problem, that mostly this is a social admit for placement as he is unsafe per family to go home in his current condition.  Physical therapy has been consulted for additional input regarding next level of care.  LCSW explained to MD as well as family that patient must meet inpatient criteria (3 day hospital stay) for him to qualify for a SNF.  LCSW explored options with family regarding payment if he does not meet inpatient criteria and paying privately and wife reports this would not be feasible at this time.  Call placed to CM to see if patient could potentially meet for inpatient criteria.  LCSW also completed chart review with regards to patient being admitted with qualifying stay in last 30 days, however he has not been to the hospital.  Awaiting review from CM and PT.  Also will place call to AFort Thompsonfor appropriate admission and possibility of LOG if warranted.    Employment status:  Retired IForensic scientist  Medicare, Managed Care PT Recommendations:  Not assessed at this time (pending PT consult) Information / Referral to community resources:  [place] ([place])  Patient/Family's Response to care:  Family aware of barriers and voice understanding  Patient/Family's Understanding of and Emotional Response to Diagnosis, Current Treatment, and Prognosis:  Family aware of patient's current medical state as being "medically okay", with reasons for admissions only being for placement.  Family appears to lack  understanding if being admitted and in observation status, patient will not have insurance authorization for SNF.  Family does not voice any type of emotion, but remain pleasant and agreeable for work up.  They are hopeful for rehab.  Emotional Assessment Appearance:  Appears stated age Attitude/Demeanor/Rapport:  Other (resting in bed groaning) Affect (typically observed):  Pleasant, Restless Orientation:  Oriented to Self Alcohol / Substance use:  Not Applicable Psych involvement (Current and /or in the community):  No (Comment)  Discharge Needs  Concerns to be addressed:  Financial / Insurance Concerns, Home Safety Concerns Readmission within the last 30 days:  No Current discharge risk:  Physical Impairment, Inadequate Financial Supports Barriers to Discharge:  Ship broker, Other (pending PT evaluation)   [place], LCSW 02/19/2015, 1:42 PM

## 2015-02-19 NOTE — ED Notes (Signed)
Per EMS - pt c/o generalized fatigue/weakness, specifically in right leg - unable to stand this morning. Last seen normal last night. Uses walker at home. From home. No neuro deficits noted. BP 152/95, 95% RA, 80bpm, cbg 113.

## 2015-02-19 NOTE — ED Provider Notes (Signed)
CSN: [id number]     Arrival date & time 02/19/15  7412 History   First MD Initiated Contact with Patient 02/19/15 0830     Chief Complaint  Patient presents with  . Fatigue     (Consider location/radiation/quality/duration/timing/severity/associated sxs/prior Treatment) HPI   81 year old male brought in by family for evaluation of weakness. Patient typically ambulates with a walker. He has been getting around much slower over the past week. Today he was unable to get up from his reclining chair which he sleeps in. He has not voiced any specific complaints to his family recently. He tells me that he feels "pretty good." His wife reports that he drags her his right leg when he walks, but this is chronic. No reported fever. No recent falls. No recent medication changes. Appetite chronically poor. "He always rattles when he breaths but it's been getting louder." Junky sounding cough. He denies SOB to me. Denies any pain.   Past Medical History  Diagnosis Date  . Hypertension   . Macular degeneration of both eyes    Past Surgical History  Procedure Laterality Date  . Colon surgery     History reviewed. No pertinent family history. History  Substance Use Topics  . Smoking status: Former Smoker    Types: Pipe    Quit date: 06/22/1963  . Smokeless tobacco: Never Used     Comment: quit cigarettes, but is a pipe smoker  . Alcohol Use: No    Review of Systems  All systems reviewed and negative, other than as noted in HPI.   Allergies  Review of patient's allergies indicates no known allergies.  Home Medications   Prior to Admission medications   Medication Sig Start Date End Date Taking? Authorizing Provider  amLODipine (NORVASC) 5 MG tablet TAKE 1 TABLET BY MOUTH EVERY DAY 06/20/13   Elby Showers, MD  aspirin EC 81 MG tablet Take 81 mg by mouth daily.    Historical Provider, MD  lisinopril (PRINIVIL,ZESTRIL) 20 MG tablet TAKE ONE TABLET BY MOUTH ONCE DAILY 07/11/14   Elby Showers, MD   BP 156/79 mmHg  Pulse 95  Temp(Src) 100.1 F (37.8 C) (Rectal)  Resp 29  SpO2 96% Physical Exam  Constitutional: He is oriented to person, place, and time. He appears well-developed and well-nourished. No distress.  HENT:  Head: Normocephalic and atraumatic.  Eyes: Conjunctivae are normal. Right eye exhibits no discharge. Left eye exhibits no discharge.  Neck: Neck supple.  Cardiovascular: Normal rate, regular rhythm and normal heart sounds.  Exam reveals no gallop and no friction rub.   No murmur heard. Pulmonary/Chest: Effort normal and breath sounds normal. No respiratory distress.  Abdominal: Soft. He exhibits no distension. There is no tenderness.  Musculoskeletal: He exhibits no edema or tenderness.  Neurological: He is alert and oriented to person, place, and time.  Hard of hearing, cranial nerves are otherwise intact. Globally weak. No focal motor deficit.  Skin: Skin is warm and dry.  Psychiatric: He has a normal mood and affect. His behavior is normal. Thought content normal.  Nursing note and vitals reviewed.   ED Course  Procedures (including critical care time) Labs Review Labs Reviewed  BASIC METABOLIC PANEL - Abnormal; Notable for the following:    CO2 19 (*)    Glucose, Bld 100 (*)    Creatinine, Ser 1.44 (*)    Calcium 8.8 (*)    GFR calc non Af Amer 42 (*)    GFR calc Af Wyvonnia Lora  48 (*)    All other components within normal limits  CBC WITH DIFFERENTIAL/PLATELET - Abnormal; Notable for the following:    WBC 10.9 (*)    Neutrophils Relative % 81 (*)    Neutro Abs 8.9 (*)    Lymphocytes Relative 9 (*)    All other components within normal limits  URINALYSIS, ROUTINE W REFLEX MICROSCOPIC (NOT AT [facility]) - Abnormal; Notable for the following:    Color, Urine AMBER (*)    Hgb urine dipstick LARGE (*)    Bilirubin Urine SMALL (*)    Ketones, ur 15 (*)    All other components within normal limits  URINE MICROSCOPIC-ADD ON - Abnormal; Notable for the  following:    Casts HYALINE CASTS (*)    All other components within normal limits  URINE CULTURE  I-STAT CG4 LACTIC ACID, ED    Imaging Review Ct Head Wo Contrast  02/19/2015   CLINICAL DATA:  One week history of weakness in the legs.  EXAM: CT HEAD WITHOUT CONTRAST  TECHNIQUE: Contiguous axial images were obtained from the base of the skull through the vertex without intravenous contrast.  COMPARISON:  Brain MR dated 12/13/2011  FINDINGS: No mass lesion. No midline shift. No acute hemorrhage or hematoma. No extra-axial fluid collections. No evidence of acute infarction. There is diffuse cerebral cortical atrophy with secondary ventricular dilatation. Chronic periventricular white matter lucency consistent with small vessel ischemic disease, unchanged. No osseous abnormalities.  IMPRESSION: No acute intracranial abnormality. Stable atrophy. Minimal chronic small vessel ischemic disease.   Electronically Signed   By: Lorriane Shire M.D.   On: 02/19/2015 09:57   Dg Chest Portable 1 View  02/19/2015   CLINICAL DATA:  Chest congestion and wheezing.  Weakness.  EXAM: PORTABLE CHEST - 1 VIEW  COMPARISON:  None.  FINDINGS: There is minimal atelectasis at the left lung base laterally. The lungs are otherwise clear. Heart size and vascularity are normal. Calcification in the arch of the aorta. No osseous abnormality.  IMPRESSION: Minimal atelectasis at the left lung base.  Aortic atherosclerosis.   Electronically Signed   By: Lorriane Shire M.D.   On: 02/19/2015 09:27     EKG Interpretation   Date/Time:  Thursday February 19 2015 08:29:59 EDT Ventricular Rate:  100 PR Interval:  55 QRS Duration: 146 QT Interval:  382 QTC Calculation: 493 R Axis:   18 Text Interpretation:  Sinus tachycardia Right bundle branch block Abnormal  inferior Q waves similar to previous EKG aside from increased rate  Confirmed by Naftali Carchi  MD, Helix Lafontaine ([id number]) on 02/19/2015 8:43:27 AM      MDM   Final diagnoses:  Severe  muscle deconditioning    89yM with weakness. Seems to have mild L facial droop to me, but family reports he looks the same to them. No focal deficits otherwise. Global weakness. I get the sense this is not a CVA, but will CT head. Family reports that drags R leg when walks but strength R leg seems symmetric as compared to L although both are weak.  Consider infectious etiology. Generalized weakness, temp 1001. Possibly infectious. Will check UA, CXR, labs.   W/u without obvious explanatory cause of increased weakness. Will have PT eval and consult social work. Pt lives at home. Family unable to provide care at patient's current level of function.   Virgel Manifold, MD 02/20/15 1452

## 2015-02-19 NOTE — ED Notes (Signed)
PT at bedside.

## 2015-02-20 ENCOUNTER — Telehealth: Payer: Self-pay | Admitting: *Deleted

## 2015-02-20 LAB — URINE CULTURE

## 2015-02-20 NOTE — Telephone Encounter (Signed)
Pt wife called to say that she has not received DME ordered yesterday.  NCM contacted Jerm[place] of [place] to ensure order was faxed to office and DME would be delivered today.  NCM called pt wife back to inform her that [facility] will call with delivery time today.

## 2015-02-21 ENCOUNTER — Telehealth: Payer: Self-pay | Admitting: *Deleted

## 2015-02-21 DIAGNOSIS — R627 Adult failure to thrive: Secondary | ICD-10-CM

## 2015-02-21 DIAGNOSIS — R1310 Dysphagia, unspecified: Secondary | ICD-10-CM

## 2015-02-21 DIAGNOSIS — L89323 Pressure ulcer of left buttock, stage 3: Secondary | ICD-10-CM | POA: Diagnosis not present

## 2015-02-21 NOTE — Telephone Encounter (Signed)
Reviewed case and needs with both [facility] rep and EDP. [facility] rep is Jeneen Rinks [phone])  who stated original DME orders lacking in substance and must be completed before DME can be delivered to pts home. Reviewed case with Lacretia Leigh, MD who  will review completed orders and CM can then fax updated orders to [facility] for delivery. Will update June Deangelo @ [phone].

## 2015-02-21 NOTE — Progress Notes (Signed)
MD Order for Hospital Bed updated and faxed to [facility] at 1358 with confirmation. Made June Kawashima aware that she should be hearing from DME agency for delivery of bed at any time. Verbalized understanding.

## 2015-02-23 ENCOUNTER — Inpatient Hospital Stay (HOSPITAL_COMMUNITY)
Admission: EM | Admit: 2015-02-23 | Discharge: 2015-02-27 | DRG: 195 | Disposition: A | Discharge status: Hospice | Payer: Medicare Other | Attending: Internal Medicine | Admitting: Internal Medicine

## 2015-02-23 ENCOUNTER — Emergency Department (HOSPITAL_COMMUNITY): Payer: Medicare Other

## 2015-02-23 ENCOUNTER — Encounter (HOSPITAL_COMMUNITY): Payer: Self-pay | Admitting: Nephrology

## 2015-02-23 ENCOUNTER — Telehealth: Payer: Self-pay | Admitting: Internal Medicine

## 2015-02-23 DIAGNOSIS — H548 Legal blindness, as defined in USA: Secondary | ICD-10-CM | POA: Diagnosis present

## 2015-02-23 DIAGNOSIS — H9190 Unspecified hearing loss, unspecified ear: Secondary | ICD-10-CM

## 2015-02-23 DIAGNOSIS — R627 Adult failure to thrive: Secondary | ICD-10-CM | POA: Diagnosis present

## 2015-02-23 DIAGNOSIS — R1310 Dysphagia, unspecified: Secondary | ICD-10-CM | POA: Diagnosis present

## 2015-02-23 DIAGNOSIS — Z7401 Bed confinement status: Secondary | ICD-10-CM | POA: Diagnosis not present

## 2015-02-23 DIAGNOSIS — J189 Pneumonia, unspecified organism: Secondary | ICD-10-CM | POA: Diagnosis present

## 2015-02-23 DIAGNOSIS — M4802 Spinal stenosis, cervical region: Secondary | ICD-10-CM | POA: Diagnosis present

## 2015-02-23 DIAGNOSIS — Z7189 Other specified counseling: Secondary | ICD-10-CM | POA: Diagnosis not present

## 2015-02-23 DIAGNOSIS — Z993 Dependence on wheelchair: Secondary | ICD-10-CM

## 2015-02-23 DIAGNOSIS — Z66 Do not resuscitate: Secondary | ICD-10-CM | POA: Diagnosis present

## 2015-02-23 DIAGNOSIS — Z515 Encounter for palliative care: Secondary | ICD-10-CM | POA: Diagnosis present

## 2015-02-23 DIAGNOSIS — H547 Unspecified visual loss: Secondary | ICD-10-CM | POA: Diagnosis present

## 2015-02-23 DIAGNOSIS — H3530 Unspecified macular degeneration: Secondary | ICD-10-CM | POA: Diagnosis present

## 2015-02-23 DIAGNOSIS — R4182 Altered mental status, unspecified: Secondary | ICD-10-CM | POA: Diagnosis not present

## 2015-02-23 DIAGNOSIS — L89152 Pressure ulcer of sacral region, stage 2: Secondary | ICD-10-CM | POA: Diagnosis present

## 2015-02-23 DIAGNOSIS — Z87891 Personal history of nicotine dependence: Secondary | ICD-10-CM

## 2015-02-23 DIAGNOSIS — F0390 Unspecified dementia without behavioral disturbance: Secondary | ICD-10-CM | POA: Diagnosis present

## 2015-02-23 DIAGNOSIS — I10 Essential (primary) hypertension: Secondary | ICD-10-CM | POA: Diagnosis present

## 2015-02-23 DIAGNOSIS — Z7982 Long term (current) use of aspirin: Secondary | ICD-10-CM | POA: Diagnosis not present

## 2015-02-23 DIAGNOSIS — L8990 Pressure ulcer of unspecified site, unspecified stage: Secondary | ICD-10-CM | POA: Diagnosis present

## 2015-02-23 DIAGNOSIS — J690 Pneumonitis due to inhalation of food and vomit: Secondary | ICD-10-CM

## 2015-02-23 LAB — I-STAT CG4 LACTIC ACID, ED: Lactic Acid, Venous: 2.78 mmol/L (ref 0.5–2.0)

## 2015-02-23 LAB — COMPREHENSIVE METABOLIC PANEL
ALT: 19 U/L (ref 17–63)
ANION GAP: 13 (ref 5–15)
AST: 28 U/L (ref 15–41)
Albumin: 3 g/dL — ABNORMAL LOW (ref 3.5–5.0)
Alkaline Phosphatase: 54 U/L (ref 38–126)
BUN: 38 mg/dL — ABNORMAL HIGH (ref 6–20)
CALCIUM: 8.6 mg/dL — AB (ref 8.9–10.3)
CO2: 19 mmol/L — ABNORMAL LOW (ref 22–32)
CREATININE: 1.32 mg/dL — AB (ref 0.61–1.24)
Chloride: 109 mmol/L (ref 101–111)
GFR calc non Af Amer: 46 mL/min — ABNORMAL LOW (ref >60)
GFR, EST AFRICAN AMERICAN: 54 mL/min — AB (ref >60)
Glucose, Bld: 112 mg/dL — ABNORMAL HIGH (ref 65–99)
Potassium: 4.2 mmol/L (ref 3.5–5.1)
SODIUM: 141 mmol/L (ref 135–145)
Total Bilirubin: 1.9 mg/dL — ABNORMAL HIGH (ref 0.3–1.2)
Total Protein: 6.7 g/dL (ref 6.5–8.1)

## 2015-02-23 LAB — URINE MICROSCOPIC-ADD ON

## 2015-02-23 LAB — URINALYSIS, ROUTINE W REFLEX MICROSCOPIC
Bilirubin Urine: NEGATIVE
GLUCOSE, UA: NEGATIVE mg/dL
Ketones, ur: 15 mg/dL — AB
Leukocytes, UA: NEGATIVE
Nitrite: NEGATIVE
PH: 5.5 (ref 5.0–8.0)
Protein, ur: 30 mg/dL — AB
SPECIFIC GRAVITY, URINE: 1.025 (ref 1.005–1.030)
Urobilinogen, UA: 1 mg/dL (ref 0.0–1.0)

## 2015-02-23 LAB — CBC WITH DIFFERENTIAL/PLATELET
Basophils Absolute: 0 10*3/uL (ref 0.0–0.1)
Basophils Relative: 0 % (ref 0–1)
EOS ABS: 0 10*3/uL (ref 0.0–0.7)
Eosinophils Relative: 0 % (ref 0–5)
HCT: 36.5 % — ABNORMAL LOW (ref 39.0–52.0)
HEMOGLOBIN: 12.5 g/dL — AB (ref 13.0–17.0)
Lymphocytes Relative: 8 % — ABNORMAL LOW (ref 12–46)
Lymphs Abs: 0.7 10*3/uL (ref 0.7–4.0)
MCH: 29.7 pg (ref 26.0–34.0)
MCHC: 34.2 g/dL (ref 30.0–36.0)
MCV: 86.7 fL (ref 78.0–100.0)
Monocytes Absolute: 1.5 10*3/uL — ABNORMAL HIGH (ref 0.1–1.0)
Monocytes Relative: 17 % — ABNORMAL HIGH (ref 3–12)
NEUTROS ABS: 6.7 10*3/uL (ref 1.7–7.7)
Neutrophils Relative %: 75 % (ref 43–77)
PLATELETS: 308 10*3/uL (ref 150–400)
RBC: 4.21 MIL/uL — ABNORMAL LOW (ref 4.22–5.81)
RDW: 13.6 % (ref 11.5–15.5)
WBC: 8.9 10*3/uL (ref 4.0–10.5)

## 2015-02-23 LAB — TSH: TSH: 0.649 u[IU]/mL (ref 0.350–4.500)

## 2015-02-23 LAB — TROPONIN I: Troponin I: 0.04 ng/mL — ABNORMAL HIGH (ref <0.031)

## 2015-02-23 LAB — LIPASE, BLOOD: LIPASE: 23 U/L (ref 22–51)

## 2015-02-23 LAB — AMMONIA: Ammonia: 32 umol/L (ref 9–35)

## 2015-02-23 MED ORDER — LISINOPRIL 20 MG PO TABS
20.0000 mg | ORAL_TABLET | Freq: Every day | ORAL | Status: DC
  Filled 2015-02-23 (×3): qty 1

## 2015-02-23 MED ORDER — CETYLPYRIDINIUM CHLORIDE 0.05 % MT LIQD
7.0000 mL | Freq: Two times a day (BID) | OROMUCOSAL | Status: DC
  Administered 2015-02-23 – 2015-02-25 (×5): 7 mL via OROMUCOSAL

## 2015-02-23 MED ORDER — DORZOLAMIDE HCL-TIMOLOL MAL 2-0.5 % OP SOLN
1.0000 [drp] | Freq: Every day | OPHTHALMIC | Status: DC
  Administered 2015-02-24 – 2015-02-26 (×3): 1 [drp] via OPHTHALMIC
  Filled 2015-02-23: qty 10

## 2015-02-23 MED ORDER — ASPIRIN EC 81 MG PO TBEC
81.0000 mg | DELAYED_RELEASE_TABLET | Freq: Every day | ORAL | Status: DC
  Filled 2015-02-23 (×3): qty 1

## 2015-02-23 MED ORDER — SODIUM CHLORIDE 0.9 % IV BOLUS (SEPSIS)
500.0000 mL | Freq: Once | INTRAVENOUS | Status: AC
  Administered 2015-02-23: 500 mL via INTRAVENOUS

## 2015-02-23 MED ORDER — SODIUM CHLORIDE 0.9 % IV BOLUS (SEPSIS)
1000.0000 mL | Freq: Once | INTRAVENOUS | Status: AC
  Administered 2015-02-23: 1000 mL via INTRAVENOUS

## 2015-02-23 MED ORDER — CEFTRIAXONE SODIUM IN DEXTROSE 20 MG/ML IV SOLN
1.0000 g | INTRAVENOUS | Status: DC
  Filled 2015-02-23: qty 50

## 2015-02-23 MED ORDER — DEXTROSE 5 % IV SOLN
500.0000 mg | INTRAVENOUS | Status: DC
  Administered 2015-02-24 – 2015-02-25 (×2): 500 mg via INTRAVENOUS
  Filled 2015-02-23 (×3): qty 500

## 2015-02-23 MED ORDER — CEFTRIAXONE SODIUM IN DEXTROSE 20 MG/ML IV SOLN
1.0000 g | INTRAVENOUS | Status: DC
  Administered 2015-02-24 – 2015-02-25 (×2): 1 g via INTRAVENOUS
  Filled 2015-02-23 (×3): qty 50

## 2015-02-23 MED ORDER — DEXTROSE 5 % IV SOLN
500.0000 mg | Freq: Once | INTRAVENOUS | Status: AC
  Administered 2015-02-23: 500 mg via INTRAVENOUS
  Filled 2015-02-23: qty 500

## 2015-02-23 MED ORDER — ENOXAPARIN SODIUM 40 MG/0.4ML ~~LOC~~ SOLN
40.0000 mg | SUBCUTANEOUS | Status: DC
  Administered 2015-02-23 – 2015-02-25 (×3): 40 mg via SUBCUTANEOUS
  Filled 2015-02-23 (×5): qty 0.4

## 2015-02-23 MED ORDER — KCL IN DEXTROSE-NACL 20-5-0.45 MEQ/L-%-% IV SOLN
INTRAVENOUS | Status: DC
  Administered 2015-02-23 – 2015-02-26 (×6): via INTRAVENOUS
  Filled 2015-02-23 (×11): qty 1000

## 2015-02-23 MED ORDER — DEXTROSE 5 % IV SOLN
1.0000 g | Freq: Once | INTRAVENOUS | Status: AC
  Administered 2015-02-23: 1 g via INTRAVENOUS
  Filled 2015-02-23: qty 10

## 2015-02-23 NOTE — ED Provider Notes (Signed)
CSN: [id number]     Arrival date & time 02/23/15  1526 History   First MD Initiated Contact with Patient 02/23/15 1527     No chief complaint on file.    (Consider location/radiation/quality/duration/timing/severity/associated sxs/prior Treatment) Patient is a 81 y.o. male presenting with altered mental status. The history is provided by the EMS personnel and a relative. No language interpreter was used.  Altered Mental Status Presenting symptoms: lethargy   Severity:  Moderate Most recent episode:  Today Episode history:  Continuous Timing:  Constant Progression:  Worsening Chronicity:  New Context: dementia   Context: not a recent change in medication, not a recent illness and not a recent infection   Associated symptoms: decreased appetite   Associated symptoms: no abdominal pain, normal movement, no eye deviation and no fever     Past Medical History  Diagnosis Date  . Hypertension   . Macular degeneration of both eyes    Past Surgical History  Procedure Laterality Date  . Colon surgery     No family history on file. History  Substance Use Topics  . Smoking status: Former Smoker    Types: Pipe    Quit date: 06/22/1963  . Smokeless tobacco: Never Used     Comment: quit cigarettes, but is a pipe smoker  . Alcohol Use: No    Review of Systems  Unable to perform ROS: Dementia  Constitutional: Positive for decreased appetite. Negative for fever.  Respiratory: Negative for shortness of breath.   Cardiovascular: Negative for chest pain.  Gastrointestinal: Negative for abdominal pain.      Allergies  Review of patient's allergies indicates no known allergies.  Home Medications   Prior to Admission medications   Medication Sig Start Date End Date Taking? Authorizing Provider  amLODipine (NORVASC) 5 MG tablet TAKE 1 TABLET BY MOUTH EVERY DAY Patient not taking: Reported on 02/19/2015 06/20/13   Elby Showers, MD  aspirin EC 81 MG tablet Take 81 mg by mouth  daily.    Historical Provider, MD  lisinopril (PRINIVIL,ZESTRIL) 20 MG tablet TAKE ONE TABLET BY MOUTH ONCE DAILY 07/11/14   Elby Showers, MD   BP 158/81 mmHg  Pulse 81  Temp(Src) 97.7 F (36.5 C) (Rectal)  Resp 22  SpO2 95% Physical Exam  Constitutional: Vital signs are normal. He appears well-developed. He appears lethargic. He appears cachectic. He appears ill. No distress.  HENT:  Head: Normocephalic and atraumatic.  Nose: Nose normal.  Mouth/Throat: Oropharynx is clear and moist. No oropharyngeal exudate.  Eyes: EOM are normal. Pupils are equal, round, and reactive to light.  Neck: Normal range of motion. Neck supple.  Cardiovascular: Normal rate, regular rhythm, normal heart sounds and intact distal pulses.   No murmur heard. Pulmonary/Chest: Effort normal. No respiratory distress. He has no wheezes. He exhibits no tenderness.  Wet sounding cough, mild rhonchi on bilateral bases  Abdominal: Soft. He exhibits no distension. There is no tenderness. There is no guarding.  Musculoskeletal: He exhibits no tenderness.  Neurological: He appears lethargic. No cranial nerve deficit. He exhibits abnormal muscle tone (increaesd lower extremity tone).  Lethargic but interactive to stimulus, minimally verbal.  Decreased spontaneous movement throughout and almost none on left side extremities.  Unable to cooperate with formal strength testing.  2/5 strength on left and 4/5 on right extremities diffusely.   Skin: Skin is warm and dry. He is not diaphoretic. No pallor.  Stage 3 sacral ulcer, with dressing over  Nursing note and vitals reviewed.  ED Course  Procedures (including critical care time) Labs Review Labs Reviewed  COMPREHENSIVE METABOLIC PANEL - Abnormal; Notable for the following:    CO2 19 (*)    Glucose, Bld 112 (*)    BUN 38 (*)    Creatinine, Ser 1.32 (*)    Calcium 8.6 (*)    Albumin 3.0 (*)    Total Bilirubin 1.9 (*)    GFR calc non Af Amer 46 (*)    GFR calc Af  Amer 54 (*)    All other components within normal limits  TROPONIN I - Abnormal; Notable for the following:    Troponin I 0.04 (*)    All other components within normal limits  CBC WITH DIFFERENTIAL/PLATELET - Abnormal; Notable for the following:    RBC 4.21 (*)    Hemoglobin 12.5 (*)    HCT 36.5 (*)    Lymphocytes Relative 8 (*)    Monocytes Relative 17 (*)    Monocytes Absolute 1.5 (*)    All other components within normal limits  URINALYSIS, ROUTINE W REFLEX MICROSCOPIC (NOT AT [facility]) - Abnormal; Notable for the following:    Hgb urine dipstick SMALL (*)    Ketones, ur 15 (*)    Protein, ur 30 (*)    All other components within normal limits  URINE MICROSCOPIC-ADD ON - Abnormal; Notable for the following:    Casts GRANULAR CAST (*)    All other components within normal limits  I-STAT CG4 LACTIC ACID, ED - Abnormal; Notable for the following:    Lactic Acid, Venous 2.78 (*)    All other components within normal limits  URINE CULTURE  LIPASE, BLOOD  AMMONIA    Imaging Review Dg Chest Port 1 View  02/23/2015   CLINICAL DATA:  Failure to thrive 8 months.  Increasing weakness.  EXAM: PORTABLE CHEST - 1 VIEW  COMPARISON:  02/19/2015  FINDINGS: Lungs are adequately inflated with persistent focal opacification over the lateral left base slightly worse and may be due to atelectasis versus infection. Minimal calcified plaque over the aortic arch. Cardiomediastinal silhouette and remainder of the exam is unchanged. Calcification versus contrast in the region of the left upper quadrant unchanged.  IMPRESSION: Focal opacification over the lateral left base slightly worse and may be due to atelectasis or infection.   Electronically Signed   By: Marin Olp M.D.   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  Family reported he has had limited mobility for the past few months, and has been bed bound for the past month or so.  Several episodes of choking and wet sounding cough recently.  Less interactive today than usual.  No recent known illness or medication changes.   No fever, normotensive on arrival.  Lethargic but interactive to stimulus, minimally verbal Almost no spontaneous left upper and lower extremity movement, and decreased on right but able to hold  up against gravity on right.  No signs of trauma.  Sacral ulcer but looks to be well taken care of.    Broad labs, CXR, urine sent.   Found to have a left base opacity on CXR.   Looks dry and may increase with hydration.  Based on story and CXR findings concerning for pneumonia, will treat for CAP.  Covered with rocephin and azithromycin.    Admitted to tele, Dr. Jonnie Finner for further management.  Family aware and agreeable.    If performed, labs, EKGs, and imaging were reviewed and interpreted by myself and my attending, and incorporated in the medical decision making.  Patient was seen with ED Attending, Dr. Shella Spearing, MD   Tori Milks, MD 02/25/15 [id number]  Charlesetta Shanks, MD 02/27/15 [phone]

## 2015-02-23 NOTE — Progress Notes (Signed)
Received report- awaiting arrival of patient.

## 2015-02-23 NOTE — ED Notes (Addendum)
Pt here from home via [facility] for failure to thrive x 8 months.  Per EMS, family called 911 b/c pt was becoming increasingly weak.  Pt had been moved 3 days to a hospital bed.  Pt able to follow simple commands, but cannot answer questions.  cbg 114.

## 2015-02-23 NOTE — H&P (Addendum)
[facility] History and Physical    Brandon Graves is an 81 y.o. male.  Chief Complaint: AMS, cough, decline in mobility, dysphagia HPI: 81 yo with hx of HTN and macular degeneration , has been blind for many years and has gradually become more and more debilitated over the past couple of years. Cared for by wife at home, is mostly bed-to lift chair at home w occ cruising under close supervision around the house. Last week they brought him to ED for difficulty swallowing. CT head showed marked atrophy w enlarged ventricles but no acute stroke. Was mentating well and VS were normal so was dc'd home. Since then per pts wife/ family he has had a steady decline with dec'd po intake, more coughing after eating or drinking, no BM's and has gotten weaker and weaker. Home health nursing was just started and the person that came to the house rec'd a trip to the ED.  In the ED lab work showed normal CBC, creat 1.3, CXR faint LLL infiltrate, temp was 97.5.    Family denies any recent issues with fevers, chills, CP, abd pain, n/v/d. No difficulty voiding or change in urine. No recent BM. Not eating much at all.  Poor appetite for a few months.     Past Medical History  Past Medical History  Diagnosis Date  . Hypertension   . Macular degeneration of both eyes    Past Surgical History  Past Surgical History  Procedure Laterality Date  . Colon surgery     Family History History reviewed. No pertinent family history. Social History  reports that he quit smoking about 51 years ago. His smoking use included Pipe. He has never used smokeless tobacco. He reports that he does not drink alcohol or use illicit drugs. Allergies No Known Allergies Home medications Prior to Admission medications   Medication Sig Start Date End Date Taking? Authorizing Provider  aspirin EC 81 MG tablet Take 81 mg by mouth daily.   Yes Historical Provider, MD  dorzolamide-timolol (COSOPT) 22.3-6.8 MG/ML ophthalmic  solution Place 1 drop into both eyes daily.   Yes Historical Provider, MD  lisinopril (PRINIVIL,ZESTRIL) 20 MG tablet TAKE ONE TABLET BY MOUTH ONCE DAILY 07/11/14  Yes Elby Showers, MD  amLODipine (NORVASC) 5 MG tablet TAKE 1 TABLET BY MOUTH EVERY DAY Patient not taking: Reported on 02/19/2015 06/20/13   Elby Showers, MD   Liver Function Tests  Recent Labs Lab 02/23/15 1610  AST 28  ALT 19  ALKPHOS 54  BILITOT 1.9*  PROT 6.7  ALBUMIN 3.0*    Recent Labs Lab 02/23/15 1610  LIPASE 23   CBC  Recent Labs Lab 02/19/15 0915 02/23/15 1610  WBC 10.9* 8.9  NEUTROABS 8.9* 6.7  HGB 13.5 12.5*  HCT 39.1 36.5*  MCV 87.9 86.7  PLT 305 433   Basic Metabolic Panel  Recent Labs Lab 02/19/15 0915 02/23/15 1610  NA 135 141  K 4.4 4.2  CL 104 109  CO2 19* 19*  GLUCOSE 100* 112*  BUN 19 38*  CREATININE 1.44* 1.32*  CALCIUM 8.8* 8.6*    Filed Vitals:   02/23/15 1915 02/23/15 1930 02/23/15 1945 02/23/15 2019  BP: 160/75 168/75 155/82 159/92  Pulse: 90 88 89 98  Temp:    98 F (36.7 C)  TempSrc:    Oral  Resp: 26 23 21    SpO2: 96% 98% 97% 100%   Exam: Not responsive, barely opens his eyes, disheveled, coarse wet cough occ,  frail, elderly male No rash, cyanosis or gangrene Conj injected bilat TM's obscure Throat dry w/o lesions, thrush; dentition very poor Neck no bruit or nodes or jvd Chest soft crackles L base, no bronchial BS or wheezing. R clear RRR no MRG, PMI nondisplaced Abd soft ntnd no ascites, minimal BS, no hsm GU normal circ male Ext muscle wasting 2+, no LE edema, no palpable pedal pulses No LE ulcers or wounds Sacral decub 10x 10 cm, superficial, no drainage or odor Neuro slightly contracted LE's, gen weak, no obvious focal deficit, blind by hx, not cooperative w exam  CXR (reviewed personally) > faint LLL density, no CHF UA no wbc's, 0-2 rbc's, 1.025 , 15 ketones    Assessment: 1. Cough/ LLL infiltrate -c/w pneumonia most likely. Plan admit,  IVF's, emp abx.  2. Failure-to-thrive - acute/ chronic in debilitated elderly man blind from macular degeneration. Bed-to-chair basically prior to this acute illness. DNR requested per family and would like to seriously consider comfort care if not improving much in the next 1-2 days.  3. HTN on acei 4. Sacral decub - local care 5. Blind sec to macular degen   Plan- admit, IVF, IV abx, pall care consult, DNAR.     Kelly Splinter MD [facility] Pager [phone]  If 7PM-7AM, please contact night-coverage [email or web address] Password [email or web address]

## 2015-02-23 NOTE — Progress Notes (Signed)
Patient arrived to floor from ED via stretcher, transferred to bed, SR up, call bell in reach. Bed alarm set. Family sent to waiting room while patient situated, bathed, condom cath applied. Patient alert but nonverbal. Unable to know orientation. Noted right forefinger to be black at tip, noted stage 1-2 area to right buttock red with small cracks bleeding, no depth size approximately 4cmx4cm. Area cleansed, pink foam applied. Susie with admissions aware of patient arrival and will be here soon to do admission paperwork. Assessment complete. Family at bedside, verified name/birthdate with them and new ID band applied. Unable to orient patient to room due to confusion/nonverbal. Will monitor.

## 2015-02-23 NOTE — ED Notes (Signed)
Admitting MD at bedside.

## 2015-02-23 NOTE — Telephone Encounter (Signed)
Granddaughter, Rosine Door is calling; states that patient is in the ED today.  He is not swallowing well; states she feels he is aspirating.  States that [place] says that he should be in some sort of Assisted Living or Skilled Rehab.  They also stated that patient should be turned every 2 hours and has the beginning stages of decubitus on his buttocks.  Also states wife is afraid to feed patient due to fear that he will aspirate.  Patient is too weak to ambulate on his own to the bathroom.  States he was take to the ED last week via EMS and sent home.  Family feels he shouldn't be in the home setting based on the feedback they are getting from [facility].    Advised granddaughter I would pass the information on to Dr. Renold Genta, but we no longer go to the hospital for rounds.  So, we would rely on Home Health to advise [place] of patient's needs.  At the present time, all we have from [facility] is an order for physical therapy.   Until today's call from granddaughter, we had not been informed of ANY of the above information.  Advised I would pass this along to Dr. Renold Genta.

## 2015-02-24 DIAGNOSIS — Z515 Encounter for palliative care: Secondary | ICD-10-CM

## 2015-02-24 DIAGNOSIS — Z7189 Other specified counseling: Secondary | ICD-10-CM

## 2015-02-24 LAB — COMPREHENSIVE METABOLIC PANEL
ALBUMIN: 2.4 g/dL — AB (ref 3.5–5.0)
ALK PHOS: 42 U/L (ref 38–126)
ALT: 16 U/L — ABNORMAL LOW (ref 17–63)
ANION GAP: 5 (ref 5–15)
AST: 20 U/L (ref 15–41)
BUN: 27 mg/dL — ABNORMAL HIGH (ref 6–20)
CALCIUM: 7.7 mg/dL — AB (ref 8.9–10.3)
CO2: 22 mmol/L (ref 22–32)
CREATININE: 1.2 mg/dL (ref 0.61–1.24)
Chloride: 110 mmol/L (ref 101–111)
GFR calc Af Amer: >60 mL/min (ref >60)
GFR, EST NON AFRICAN AMERICAN: 52 mL/min — AB (ref >60)
Glucose, Bld: 113 mg/dL — ABNORMAL HIGH (ref 65–99)
POTASSIUM: 3.6 mmol/L (ref 3.5–5.1)
SODIUM: 137 mmol/L (ref 135–145)
Total Bilirubin: 1.7 mg/dL — ABNORMAL HIGH (ref 0.3–1.2)
Total Protein: 5.6 g/dL — ABNORMAL LOW (ref 6.5–8.1)

## 2015-02-24 LAB — CBC
HCT: 31.1 % — ABNORMAL LOW (ref 39.0–52.0)
HEMOGLOBIN: 10.6 g/dL — AB (ref 13.0–17.0)
MCH: 29.6 pg (ref 26.0–34.0)
MCHC: 34.1 g/dL (ref 30.0–36.0)
MCV: 86.9 fL (ref 78.0–100.0)
Platelets: 273 10*3/uL (ref 150–400)
RBC: 3.58 MIL/uL — AB (ref 4.22–5.81)
RDW: 13.4 % (ref 11.5–15.5)
WBC: 7.6 10*3/uL (ref 4.0–10.5)

## 2015-02-24 LAB — GLUCOSE, CAPILLARY: Glucose-Capillary: 99 mg/dL (ref 65–99)

## 2015-02-24 MED ORDER — HYDRALAZINE HCL 20 MG/ML IJ SOLN
10.0000 mg | Freq: Four times a day (QID) | INTRAMUSCULAR | Status: DC | PRN
  Administered 2015-02-25: 10 mg via INTRAVENOUS
  Filled 2015-02-24: qty 1

## 2015-02-24 NOTE — Progress Notes (Signed)
OT Cancellation Note  Patient Details Name: Brandon Graves MRN: [id number] DOB: 02/28/1924   Cancelled Treatment:    Reason Eval/Treat Not Completed: Other (comment) Per PT note, wife requested to hold therapy evals until after paliiative consult for decision of plan  [name] OTR/L [phone] 02/24/2015, 3:20 PM

## 2015-02-24 NOTE — Progress Notes (Addendum)
PT Cancellation Note  Patient Details Name: Brandon Graves MRN: [id number] DOB: May 29, 1924   Cancelled Treatment:    Reason Eval/Treat Not Completed: Other (comment) (pt ill appearing in bed and not responsive as he was when I saw him last week. Spoke with wife who requested hold therapy evals until after paliiative consult for decision of plan.)   Melford Aase 02/24/2015, 1:53 PM Elwyn Reach, [place]

## 2015-02-24 NOTE — Evaluation (Signed)
Clinical/Bedside Swallow Evaluation Patient Details  Name: Brandon Graves MRN: [id number] Date of Birth: 01/21/1924  Today's Date: 02/24/2015 Time: SLP Start Time (ACUTE ONLY): 1779 SLP Stop Time (ACUTE ONLY): 1510 SLP Time Calculation (min) (ACUTE ONLY): 16 min  Past Medical History:  Past Medical History  Diagnosis Date  . Hypertension   . Macular degeneration of both eyes    Past Surgical History:  Past Surgical History  Procedure Laterality Date  . Colon surgery     HPI:  Community-acquired pneumonia in an elderly patient with failure to thrive and extremely poor baseline quality of life was largely wheelchair and bedbound. Probable hospice at d/c. Wife reports coughing at baseline   Assessment / Plan / Recommendation Clinical Impression  Pt presents with inability to protect airway. Prior to PO intake pt with audible standing secretions he is unable to clear despite max cues from therapist. Provided taste of water with brief oral manipulation but no swallow response. Larger boluses would result in immediate aspiration. Pt is unable to safely consume PO at this time. Only a diet for comfort with known risk of aspiration is appropriate. Discussed with Pts wife who understands and is discussing plan of care with family and palliative NP. No further interventions would be helpful at this time. SLP will sign off but please call with any questions from family.     Aspiration Risk  Severe    Diet Recommendation NPO        Other  Recommendations Oral Care Recommendations: Oral care QID   Follow Up Recommendations       Frequency and Duration        Pertinent Vitals/Pain NA    SLP Swallow Goals     Swallow Study Prior Functional Status       General Other Pertinent Information: Community-acquired pneumonia in an elderly patient with failure to thrive and extremely poor baseline quality of life was largely wheelchair and bedbound. Probable hospice at d/c. Wife reports  coughing at baseline Type of Study: Bedside swallow evaluation Previous Swallow Assessment: none Diet Prior to this Study: NPO Temperature Spikes Noted: No Respiratory Status: Supplemental O2 delivered via (comment) History of Recent Intubation: No Behavior/Cognition: Alert;Cooperative;Requires cueing Oral Cavity - Dentition: Poor condition Self-Feeding Abilities: Total assist Patient Positioning: Upright in bed Baseline Vocal Quality: Wet Volitional Cough: Weak Volitional Swallow: Unable to elicit    Oral/Motor/Sensory Function Overall Oral Motor/Sensory Function: Impaired (unable to follow commands, looks generally weak)   Ice Chips     Thin Liquid Thin Liquid: Impaired Presentation: Spoon Oral Phase Impairments: Reduced labial seal;Reduced lingual movement/coordination;Impaired anterior to posterior transit Oral Phase Functional Implications: Prolonged oral transit Pharyngeal  Phase Impairments: Suspected delayed Swallow;Wet Vocal Quality;Unable to trigger swallow    Nectar Thick     Honey Thick     Puree     Solid   GO           Herbie Baltimore, MA CCC-SLP [phone]  Lynann Beaver 02/24/2015,3:25 PM

## 2015-02-24 NOTE — Progress Notes (Signed)
Patient Demographics:    Brandon Graves, is a 81 y.o. male, DOB - 05/15/1924, FOY:[id number]  Admit date - 02/23/2015   Admitting Physician Roney Jaffe, MD  Outpatient Primary MD for the patient is Elby Showers, MD  LOS - 1   Chief Complaint  Patient presents with  . Failure To Thrive  . Extremity Weakness        Subjective:    [name] today remains in bed, appears somnolent and tired, unable to answer questions or follow commands.   Assessment  & Plan :    1. Community-acquired pneumonia in an elderly patient with failure to thrive and extremely poor baseline quality of life was largely wheelchair and bedbound. Appears extremely frail and deconditioned, on appropriate empiric anti-biotics and clinically appears stable, currently nothing by mouth speech to evaluate. PT OT to see as well.  Had detailed discussion with wife, palliative care would be involved, no heroics, DO NOT RESUSCITATE, gentle medical treatment, most likely discharged to residential hospice versus home hospice.   2. Essential hypertension. On ACE inhibitor and Norvasc at home, currently nothing by mouth and on hold, as needed IV hydralazine for now.   3. Sacral decubitus ulcer. Stage I to 2. Wound care.   4. Legally blind due to macular degeneration. Supportive care.    Code Status :  DO NOT RESUSCITATE  Family Communication  : Wife over the phone in detail  Disposition Plan  : Residential versus home hospice  Consults  :  Palliative care  Procedures  : Head CT nonacute  DVT Prophylaxis  :  Lovenox   Lab Results  Component Value Date   PLT 273 02/24/2015    Inpatient Medications  Scheduled Meds: . antiseptic oral rinse  7 mL Mouth Rinse BID  . aspirin EC  81 mg Oral Daily  . azithromycin   500 mg Intravenous Q24H  . cefTRIAXone (ROCEPHIN)  IV  1 g Intravenous Q24H  . dorzolamide-timolol  1 drop Both Eyes Daily  . enoxaparin (LOVENOX) injection  40 mg Subcutaneous Q24H  . lisinopril  20 mg Oral Daily   Continuous Infusions: . dextrose 5 % and 0.45 % NaCl with KCl 20 mEq/L 100 mL/hr at 02/24/15 0754   PRN Meds:.  Antibiotics  :    Anti-infectives    Start     Dose/Rate Route Frequency Ordered Stop   02/24/15 1800  cefTRIAXone (ROCEPHIN) 1 g in dextrose 5 % 50 mL IVPB - Premix     1 g 100 mL/hr over 30 Minutes Intravenous Every 24 hours 02/23/15 2045 03/03/15 1759   02/23/15 2100  cefTRIAXone (ROCEPHIN) 1 g in dextrose 5 % 50 mL IVPB - Premix  Status:  Discontinued     1 g 100 mL/hr over 30 Minutes Intravenous Every 24 hours 02/23/15 2042 02/23/15 2045   02/23/15 2000  azithromycin (ZITHROMAX) 500 mg in dextrose 5 % 250 mL IVPB     500 mg 250 mL/hr over 60 Minutes Intravenous Every 24 hours 02/23/15 2042 03-03-2015 1959   02/23/15 1800  cefTRIAXone (ROCEPHIN) 1 g in dextrose 5 % 50 mL IVPB     1 g 100 mL/hr over 30 Minutes Intravenous  Once 02/23/15 1757 02/23/15 1928   02/23/15  1800  azithromycin (ZITHROMAX) 500 mg in dextrose 5 % 250 mL IVPB     500 mg 250 mL/hr over 60 Minutes Intravenous  Once 02/23/15 1757 02/23/15 2054        Objective:   Filed Vitals:   02/23/15 1930 02/23/15 1945 02/23/15 2019 02/24/15 0521  BP: 168/75 155/82 159/92 144/68  Pulse: 88 89 98 62  Temp:   98 F (36.7 C) 98 F (36.7 C)  TempSrc:   Oral Oral  Resp: 23 21  22   Weight:   62.6 kg (138 lb 0.1 oz)   SpO2: 98% 97% 100% 99%    Wt Readings from Last 3 Encounters:  02/23/15 62.6 kg (138 lb 0.1 oz)  02/28/14 73.029 kg (161 lb)  06/20/13 72.576 kg (160 lb)     Intake/Output Summary (Last 24 hours) at 02/24/15 1208 Last data filed at 02/24/15 0900  Gross per 24 hour  Intake 901.67 ml  Output    600 ml  Net 301.67 ml     Physical Exam  Somnolent in bed, he opens eyes  to verbal commands but unable to answer any questions,  .AT,PERRAL Supple Neck,No JVD, No cervical lymphadenopathy appriciated.  Symmetrical Chest wall movement, Good air movement bilaterally, coarse bibasilar breath sounds RRR,No Gallops,Rubs or new Murmurs, No Parasternal Heave +ve B.Sounds, Abd Soft, No tenderness, No organomegaly appriciated, No rebound - guarding or rigidity. No Cyanosis, Clubbing or edema, No new Rash or bruise      Data Review:   Micro Results Recent Results (from the past 240 hour(s))  Urine culture     Status: None   Collection Time: 02/19/15 10:06 AM  Result Value Ref Range Status   Specimen Description URINE, RANDOM  Final   Special Requests NONE  Final   Culture   Final    MULTIPLE SPECIES PRESENT, SUGGEST RECOLLECTION IF CLINICALLY INDICATED   Report Status 02/20/2015 FINAL  Final  Urine culture     Status: None (Preliminary result)   Collection Time: 02/23/15  5:15 PM  Result Value Ref Range Status   Specimen Description URINE, RANDOM  Final   Special Requests NONE  Final   Culture NO GROWTH < 24 HOURS  Final   Report Status PENDING  Incomplete    Radiology Reports Ct Head Wo Contrast  02/19/2015   CLINICAL DATA:  One week history of weakness in the legs.  EXAM: CT HEAD WITHOUT CONTRAST  TECHNIQUE: Contiguous axial images were obtained from the base of the skull through the vertex without intravenous contrast.  COMPARISON:  Brain MR dated 12/13/2011  FINDINGS: No mass lesion. No midline shift. No acute hemorrhage or hematoma. No extra-axial fluid collections. No evidence of acute infarction. There is diffuse cerebral cortical atrophy with secondary ventricular dilatation. Chronic periventricular white matter lucency consistent with small vessel ischemic disease, unchanged. No osseous abnormalities.  IMPRESSION: No acute intracranial abnormality. Stable atrophy. Minimal chronic small vessel ischemic disease.   Electronically Signed   By: Lorriane Shire  M.D.   On: 02/19/2015 09:57   Dg Chest Port 1 View  02/23/2015   CLINICAL DATA:  Failure to thrive 8 months.  Increasing weakness.  EXAM: PORTABLE CHEST - 1 VIEW  COMPARISON:  02/19/2015  FINDINGS: Lungs are adequately inflated with persistent focal opacification over the lateral left base slightly worse and may be due to atelectasis versus infection. Minimal calcified plaque over the aortic arch. Cardiomediastinal silhouette and remainder of the exam is unchanged. Calcification versus contrast in  the region of the left upper quadrant unchanged.  IMPRESSION: Focal opacification over the lateral left base slightly worse and may be due to atelectasis or infection.   Electronically Signed   By: Marin Olp M.D.   On: 02/23/2015 16:07   Dg Chest Portable 1 View  02/19/2015   CLINICAL DATA:  Chest congestion and wheezing.  Weakness.  EXAM: PORTABLE CHEST - 1 VIEW  COMPARISON:  None.  FINDINGS: There is minimal atelectasis at the left lung base laterally. The lungs are otherwise clear. Heart size and vascularity are normal. Calcification in the arch of the aorta. No osseous abnormality.  IMPRESSION: Minimal atelectasis at the left lung base.  Aortic atherosclerosis.   Electronically Signed   By: Lorriane Shire M.D.   On: 02/19/2015 09:27     CBC  Recent Labs Lab 02/19/15 0915 02/23/15 1610 02/24/15 0420  WBC 10.9* 8.9 7.6  HGB 13.5 12.5* 10.6*  HCT 39.1 36.5* 31.1*  PLT 305 308 273  MCV 87.9 86.7 86.9  MCH 30.3 29.7 29.6  MCHC 34.5 34.2 34.1  RDW 13.3 13.6 13.4  LYMPHSABS 1.0 0.7  --   MONOABS 1.0 1.5*  --   EOSABS 0.0 0.0  --   BASOSABS 0.0 0.0  --     Chemistries   Recent Labs Lab 02/19/15 0915 02/23/15 1610 02/24/15 0420  NA 135 141 137  K 4.4 4.2 3.6  CL 104 109 110  CO2 19* 19* 22  GLUCOSE 100* 112* 113*  BUN 19 38* 27*  CREATININE 1.44* 1.32* 1.20  CALCIUM 8.8* 8.6* 7.7*  AST  --  28 20  ALT  --  19 16*  ALKPHOS  --  54 42  BILITOT  --  1.9* 1.7*    ------------------------------------------------------------------------------------------------------------------ CrCl cannot be calculated (Unknown ideal weight.). ------------------------------------------------------------------------------------------------------------------ No results for input(s): HGBA1C in the last 72 hours. ------------------------------------------------------------------------------------------------------------------ No results for input(s): CHOL, HDL, LDLCALC, TRIG, CHOLHDL, LDLDIRECT in the last 72 hours. ------------------------------------------------------------------------------------------------------------------  Recent Labs  02/23/15 1958  TSH 0.649   ------------------------------------------------------------------------------------------------------------------ No results for input(s): VITAMINB12, FOLATE, FERRITIN, TIBC, IRON, RETICCTPCT in the last 72 hours.  Coagulation profile No results for input(s): INR, PROTIME in the last 168 hours.  No results for input(s): DDIMER in the last 72 hours.  Cardiac Enzymes  Recent Labs Lab 02/23/15 1610  TROPONINI 0.04*   ------------------------------------------------------------------------------------------------------------------ Invalid input(s): POCBNP   Time Spent in minutes   35   Jolan Mealor K M.D on 02/24/2015 at 12:08 PM  Between 7am to 7pm - Pager - [phone]  After 7pm go to [email or web address] - password [facility]  [facility] -  Office  [phone]

## 2015-02-24 NOTE — Progress Notes (Signed)
Nutrition Brief Note  Patient identified on the Malnutrition Screening Tool (MST) Report. Family is considering transition to comfort care. Palliative Care Team consult ordered. No nutrition interventions warranted at this time.  Please consult as needed.   Arthur Holms, RD, LDN Pager #: [phone] After-Hours Pager #: [phone]

## 2015-02-24 NOTE — Care Management Note (Signed)
Case Management Note  Patient Details  Name: SEVERUS BRODZINSKI MRN: [id number] Date of Birth: 13-Mar-1924  Subjective/Objective:                 Patient recently discharged from ED. Home health through [facility], hospital bed, wheelchair, bedside commode at home. HH to resume at discharge vs. Home hospice vs. Inpatient hospice. WIll continue to follow.   Action/Plan:   Expected Discharge Date:  02/27/15               Expected Discharge Plan:  Home w Hospice Care  In-House Referral:  Clinical Social Work  Discharge planning Services  CM Consult  Post Acute Care Choice:    Choice offered to:     DME Arranged:    DME Agency:     HH Arranged:    [place] Agency:     Status of Service:  In process, will continue to follow  Medicare Important Message Given:    Date Medicare IM Given:    Medicare IM give by:    Date Additional Medicare IM Given:    Additional Medicare Important Message give by:     If discussed at [place] of Stay Meetings, dates discussed:    Additional Comments:  Carles Collet, RN 02/24/2015, 11:04 AM

## 2015-02-24 NOTE — Consult Note (Addendum)
WOC wound consult note Reason for Consult: Consult requested for buttocks wound.  Wife at bedside states it "has been there awhile and home health helps take care of it."  She has been using barrier cream prior to admission for topical treatment. Wound type: Stage 2 pressure injury to buttock Pressure Ulcer POA: Yes Measurement: 4X4X.1cm red and moist with small patchy areas of darker red inside which may evolve into a deep tissue injury. Drainage (amount, consistency, odor) Small amt yellow drainage, no odor Periwound: Intact skin surrounding Dressing procedure/placement/frequency: Foam dressing to protect and promote healing.  Discussed plan of care with wife at bedside and she verbalized understanding. Please re-consult if further assistance is needed.  Thank-you,  Julien Girt MSN, [place], [place], [place], [place]

## 2015-02-24 NOTE — Progress Notes (Signed)
UR completed.    Claudell Wohler W. Lynde Ludwig, RN, BSN  Trauma/Neuro ICU Case Manager [phone] 

## 2015-02-24 NOTE — Progress Notes (Signed)
[facility]  Patient Status: Active (receiving services up to time of hospitalization)  [facility] is providing the following services: RN, PT, OT and HHA  If patient discharges after hours, please call [phone].   [name] 02/24/2015, 11:57 AM

## 2015-02-25 DIAGNOSIS — L8990 Pressure ulcer of unspecified site, unspecified stage: Secondary | ICD-10-CM | POA: Diagnosis present

## 2015-02-25 DIAGNOSIS — R627 Adult failure to thrive: Secondary | ICD-10-CM

## 2015-02-25 DIAGNOSIS — Z7189 Other specified counseling: Secondary | ICD-10-CM | POA: Diagnosis present

## 2015-02-25 DIAGNOSIS — J189 Pneumonia, unspecified organism: Principal | ICD-10-CM

## 2015-02-25 DIAGNOSIS — Z515 Encounter for palliative care: Secondary | ICD-10-CM | POA: Diagnosis present

## 2015-02-25 LAB — URINE CULTURE: Culture: NO GROWTH

## 2015-02-25 NOTE — Care Management Important Message (Signed)
Important Message  Patient Details  Name: Brandon Graves MRN: [id number] Date of Birth: 1924-03-03   Medicare Important Message Given:  [facility] notification given    Nathen May 02/25/2015, 11:25 AMImportant Message  Patient Details  Name: Brandon Graves MRN: [id number] Date of Birth: [birth year]   Medicare Important Message Given:  Yes-second notification given    Nathen May 02/25/2015, 11:25 AM

## 2015-02-25 NOTE — Consult Note (Signed)
Consultation Note Date: 02/25/2015   Patient Name: Brandon Graves  DOB: 1923/09/12  MRN: [id number]  Age / Sex: 81 y.o., male   PCP: Elby Showers, MD Referring Physician: Louellen Molder, MD  Reason for Consultation: Establishing goals of care and Psychosocial/spiritual support  Palliative Care Assessment and Plan Summary of Established Goals of Care and Medical Treatment Preferences   Clinical Assessment/Narrative: Brandon Graves looks very frail lying bed today. His wife, Brandon Graves, is at his bedside and she looks to be in poor health also.  We talk about Brandon Graves recent decline and how he had been able to walk, slowly with walker, a few weeks ago.  She talks about his decreased appetite for the last 6 months, and being "sking and bones".  That he will only eat donuts and chocolate milk.  She also talks about more coughing with eating and even sipping liquids lately;  and we talk about dysphagia and aspiration pne.    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   Mrs. Bents's wish is to have  24-48 hours, at this time, to see if Brandon Graves can improve.  She states that she cannot care for him at home and is unsure if the direction for his care.  She becomes tearful several times, but voices no rebuttal when told that Brandon Graves is near end of life.   They have not decided to go to full comfort care, as they wish to have a family meeting 7/19 with both ons present.  Mrs. Snarski states Brandon Graves is having a difficult time and wants more done at this point. We talk about giving Brandon Graves 24-48 hours to see if he is strong enough to turn around.    Mrs. Harig was able to see the Speech therapy evaluation.  We discussed his likely course, and being unable to swallow.  We also discussed the risks of tube feeding and Mrs. Vitelli states she is against PEG tube at this time.   Contacts/Participants in Discussion: Primary Decision Maker:  Brandon Graves is not communicating clearly at this time, and his wife, Brandon Graves, has [place].  He has a son, Brandon Graves who is a caregiver, and a son who lives in [place].  HCPOA: yes  Brandon Graves.    Code Status/Advance Care Planning:  DNR  We talk about life support, CPR, Intubation, tube feeding and the risks associtated, and Mrs. Markes tearfully states " I cant do that to him."  She states that her son from [place] will be in today, and they will talk about their choices tonight.    Psycho-social/Spiritual:   Support System: Lives with wife, but weaker the last few weeks. Son, Brandon Graves is local caregiver.    Prognosis: Unable to determine  Discharge Planning:  [facility] for rehab with Palliative care service follow-up or Hospice inpatient if Brandon Graves does not respond to treatment.        Chief Complaint:  AMS, cough, decline in mobility, dysphagia History of Present Illness:  81 yo with hx of HTN and macular degeneration , has been blind for many years and has gradually become more and more debilitated over the past couple of years. Cared for by wife at home, is mostly bed-to lift chair at home w occ cruising under close supervision around the house. Last week they brought him to ED for difficulty swallowing. CT head showed marked atrophy w enlarged ventricles but no acute  stroke. Was mentating well and VS were normal so was dc'd home. Since then per pts wife/ family he has had a steady decline with dec'd po intake, more coughing after eating or drinking, no BM's and has gotten weaker and weaker. Home health nursing was just started and the person that came to the house rec'd a trip to the ED. In the ED lab work showed normal CBC, creat 1.3, CXR faint LLL infiltrate, temp was 97.5.   Family denies any recent issues with fevers, chills, CP, abd pain, n/v/d. No difficulty voiding or change in urine. No recent BM. Not eating much at all. Poor appetite for a few  months.   Primary Diagnoses  Present on Admission:  . CAP (community acquired pneumonia) . FTT (failure to thrive) in adult . Blindness . Macular degeneration of both eyes . Essential hypertension, benign . Spinal stenosis in cervical region  Palliative Review of Systems: Brandon Graves is unable to participate in ROS.   I have reviewed the medical record, interviewed the patient and family, and examined the patient. The following aspects are pertinent.  Past Medical History  Diagnosis Date  . Hypertension   . Macular degeneration of both eyes    History   Social History  . Marital Status: Single    Spouse Name: N/A  . Number of Children: N/A  . Years of Education: N/A   Social History Main Topics  . Smoking status: Former Smoker    Types: Pipe    Quit date: 06/22/1963  . Smokeless tobacco: Never Used     Comment: quit cigarettes, but is a pipe smoker  . Alcohol Use: No  . Drug Use: No  . Sexual Activity: Not on file   Other Topics Concern  . None   Social History Narrative   History reviewed. No pertinent family history. Scheduled Meds: . antiseptic oral rinse  7 mL Mouth Rinse BID  . aspirin EC  81 mg Oral Daily  . azithromycin  500 mg Intravenous Q24H  . cefTRIAXone (ROCEPHIN)  IV  1 g Intravenous Q24H  . dorzolamide-timolol  1 drop Both Eyes Daily  . enoxaparin (LOVENOX) injection  40 mg Subcutaneous Q24H  . lisinopril  20 mg Oral Daily   Continuous Infusions: . dextrose 5 % and 0.45 % NaCl with KCl 20 mEq/L 100 mL/hr at 02/25/15 0826   PRN Meds:.hydrALAZINE Medications Prior to Admission:  Prior to Admission medications   Medication Sig Start Date End Date Taking? Authorizing Provider  aspirin EC 81 MG tablet Take 81 mg by mouth daily.   Yes Historical Provider, MD  dorzolamide-timolol (COSOPT) 22.3-6.8 MG/ML ophthalmic solution Place 1 drop into both eyes daily.   Yes Historical Provider, MD  lisinopril (PRINIVIL,ZESTRIL) 20 MG tablet TAKE ONE  TABLET BY MOUTH ONCE DAILY 07/11/14  Yes Elby Showers, MD  amLODipine (NORVASC) 5 MG tablet TAKE 1 TABLET BY MOUTH EVERY DAY Patient not taking: Reported on 02/19/2015 06/20/13   Elby Showers, MD   No Known Allergies CBC:    Component Value Date/Time   WBC 7.6 02/24/2015 0420   HGB 10.6* 02/24/2015 0420   HCT 31.1* 02/24/2015 0420   PLT 273 02/24/2015 0420   MCV 86.9 02/24/2015 0420   NEUTROABS 6.7 02/23/2015 1610   LYMPHSABS 0.7 02/23/2015 1610   MONOABS 1.5* 02/23/2015 1610   EOSABS 0.0 02/23/2015 1610   BASOSABS 0.0 02/23/2015 1610   Comprehensive Metabolic Panel:    Component Value Date/Time   NA  137 02/24/2015 0420   K 3.6 02/24/2015 0420   CL 110 02/24/2015 0420   CO2 22 02/24/2015 0420   BUN 27* 02/24/2015 0420   CREATININE 1.20 02/24/2015 0420   CREATININE 1.25 02/28/2014 1055   GLUCOSE 113* 02/24/2015 0420   CALCIUM 7.7* 02/24/2015 0420   AST 20 02/24/2015 0420   ALT 16* 02/24/2015 0420   ALKPHOS 42 02/24/2015 0420   BILITOT 1.7* 02/24/2015 0420   PROT 5.6* 02/24/2015 0420   ALBUMIN 2.4* 02/24/2015 0420    Physical Exam: Vital Signs: BP 149/72 mmHg  Pulse 57  Temp(Src) 98.9 F (37.2 C) (Oral)  Resp 24  Ht 5\' 8"  (1.727 m)  Wt 62.6 kg (138 lb 0.1 oz)  BMI 20.99 kg/m2  SpO2 95% SpO2: SpO2: 95 % O2 Device: O2 Device: Not Delivered O2 Flow Rate:   Intake/output summary:  Intake/Output Summary (Last 24 hours) at 02/25/15 0855 Last data filed at 02/25/15 0700  Gross per 24 hour  Intake   2800 ml  Output    425 ml  Net   2375 ml   LBM: Last BM Date:  (pta) Baseline Weight: Weight: 62.6 kg (138 lb 0.1 oz) Most recent weight: Weight: 62.6 kg (138 lb 0.1 oz)  Exam Findings:  GEN:  Frail elderly, makes but does not keep eye contact.  RESP:  Even and non labored, some cough GI:  Soft rounded.           Palliative Performance Scale: 30% at best.               Additional Data Reviewed: Recent Labs     02/23/15  1610  02/24/15  0420  WBC  8.9  7.6   HGB  12.5*  10.6*  PLT  308  273  NA  141  137  BUN  38*  27*  CREATININE  1.32*  1.20     Time In: 1330  Time Out: 1500 Time Total:   90 minutes Greater than 50%  of this time was spent counseling and coordinating care related to the above assessment and plan.  Plan of care discussed with ST and Dr. Clementeen Graham.   Signed by: Drue Novel, NP  Drue Novel, NP  02/25/2015, 8:55 AM  Please contact Palliative Medicine Team phone at [phone] for questions and concerns.

## 2015-02-25 NOTE — Progress Notes (Signed)
Daily Progress Note   Patient Name: Brandon Graves       Date: 02/25/2015 DOB: 1924-02-05  Age: 81 y.o. MRN#: [id number] Attending Physician: Brandon Molder, MD Primary Care Physician: Brandon Showers, MD Admit Date: 02/23/2015  Reason for Consultation/Follow-up: Establishing goals of care  Subjective: Daughter Brandon Graves requesting call to set up family meeting for 7/21.  Call to work number at [phone] and spoke with Ms. Brandon Graves.  Family care meeting scheduled for Thursday 7/21 at 2 pm.   Ms. Brandon Graves asks about feeding Mr. Brandon Graves, and we talk about his poor swallowing evaluation yesterday with ST.  We talk about pleasure feeding and that he will likely aspirate, and we would not give any antibiotics for infections if we know these risks with feeding.    We talk about Mr. Brandon Graves decline in appetite over the last 6 months and his coughing with liquids in the last few weeks.  Ms. Brandon Graves is concerned about nutrition, but states that she knows her father would not want a PEG tube.  We talk about panda tube, the associated risks, and discomfort associated with placement.  We talk about this being a temporary measure and that it will not change Mr. Brandon Graves long term outcomes.  We talk about evaluating Mr. Brandon Graves over the next day and that he will likely let us know if he is able to improve.  We disscuss Ms. Brandon Graves being unable to care for him at home and inpatient Hospice will be a possibility.    Length of Stay: 2 days  Current Medications: Scheduled Meds:  . antiseptic oral rinse  7 mL Mouth Rinse BID  . aspirin EC  81 mg Oral Daily  . azithromycin  500 mg Intravenous Q24H  . cefTRIAXone (ROCEPHIN)  IV  1 g Intravenous Q24H  . dorzolamide-timolol  1 drop Both Eyes Daily  . enoxaparin (LOVENOX) injection  40 mg Subcutaneous Q24H  . lisinopril  20 mg Oral Daily    Continuous Infusions: . dextrose 5 % and 0.45 % NaCl with KCl 20 mEq/L 100 mL/hr at 02/25/15 0826    PRN  Meds: hydrALAZINE  Palliative Performance Scale: 10 to 20% at best.      Vital Signs: BP 149/72 mmHg  Pulse 57  Temp(Src) 98.9 F (37.2 C) (Oral)  Resp 24  Ht 5\' 8"  (1.727 m)  Wt 62.6 kg (138 lb 0.1 oz)  BMI 20.99 kg/m2  SpO2 95% SpO2: SpO2: 95 % O2 Device: O2 Device: Not Delivered O2 Flow Rate:    Intake/output summary:  Intake/Output Summary (Last 24 hours) at 02/25/15 1136 Last data filed at 02/25/15 0943  Gross per 24 hour  Intake   2800 ml  Output    625 ml  Net   2175 ml   LBM:   Baseline Weight: Weight: 62.6 kg (138 lb 0.1 oz) Most recent weight: Weight: 62.6 kg (138 lb 0.1 oz)             Additional Data Reviewed: Recent Labs     02/23/15  1610  02/24/15  0420  WBC  8.9  7.6  HGB  12.5*  10.6*  PLT  308  273  NA  141  137  BUN  38*  27*  CREATININE  1.32*  1.20     Problem List:  Patient Active Problem List   Diagnosis Date Noted  . Pressure ulcer 02/25/2015  . Palliative care encounter   . DNR (do not resuscitate)  discussion   . CAP (community acquired pneumonia) 02/23/2015  . FTT (failure to thrive) in adult 02/23/2015  . Blindness 02/23/2015  . Failure to thrive in adult   . Hearing loss 06/20/2013  . Essential hypertension, benign 10/13/2012  . 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 Pituitary mass 10/13/2012  . Osteoarthritis of left knee 10/13/2012  . Spinal stenosis in cervical region 10/13/2012  . Glaucoma 10/13/2012  . Macular degeneration of both eyes 10/13/2012  . Skin cancer of scalp 10/13/2012     Palliative Care Assessment & Plan    Code Status:  DNR  Goals of Care:  Discussion today regarding family meeting scheduled for 7/21 at 2pm, and discussion regarding NPO, vs pleasure feeding, vs temporary Panda tube.   Desire for further Chaplaincy support:  Not discussed today.   Prognosis: Unable to determine  Discharge Planning: [place] for rehab with Palliative care service  follow-up, possibly in patient Hospice, family is unsure of direction of care at this time.    Thank you for allowing the Palliative Medicine Team to assist in the care of this patient.   Time In:  1110 Time Out:  1140 Total time  30 minutes  Prolonged Time Billed  no     Greater than 50%  of this time was spent counseling and coordinating care related to the above assessment and plan.   Brandon Novel, NP  02/25/2015, 11:36 AM  Please contact Palliative Medicine Team phone at [phone] for questions and concerns.

## 2015-02-25 NOTE — Progress Notes (Signed)
PT Cancellation Note  Patient Details Name: Brandon Graves MRN: [id number] DOB: October 28, 1923   Cancelled Treatment:    Reason Eval/Treat Not Completed: Other (comment) (Per previous PT note, wife requested hold therapy evals until after paliiative consult)   If PT is congruent with Goals of Care, we are happy to proceed with PT eval;  If PT does not fit in [name] Goals of Care, will sign off;   Please advise; Thank you,  Roney Marion, [place] Pager [phone] Office [phone]    Roney Marion [facility] 02/25/2015, 9:41 AM

## 2015-02-25 NOTE — Progress Notes (Signed)
OT Cancellation Note  Patient Details Name: JOAKIM HUESMAN MRN: [id number] DOB: 05-02-1924   Cancelled Treatment:    Reason Eval/Treat Not Completed: Other (comment). Per palliative care note, family planning to meet tonight to discuss choices/whether to proceed with comfort care. Will plan to followup tomorrow.  [name] OTR/L [phone] 02/25/2015, 10:46 AM

## 2015-02-25 NOTE — Progress Notes (Signed)
[facility] PROGRESS NOTE  Brandon Graves [id number]:[id number] DOB: Aug 09, 1923 DOA: 02/23/2015 PCP: Elby Showers, MD Brief narrative 81 year old male with history of macular degeneration, legally blind, hypertension with progressive failure to thrive presented to the ED with poor by mouth intake, weakness,couging upon swallowing. Blood will done in the ED showed left lower lobe infiltrate and mild worsened renal function.  Assessment/Plan: Community acquired pneumonia with severe failure to thrive Quality  life is extremely poor. patient is bedbound with severe deconditioning. Continue empiric antibiotics for now. IV fluids. Swallowing evaluation and recommend nothing by mouth. Palliative care discussions for closer care pending. Given his progressive decline in symptoms and  overall health i think it would be reasonable for residential hospice. i did bring this up with the wife and both she and one of her son at bedside seem to agree. She is unable to take care of him at home.  Hypertension Medications held as patient is nothing by mouth. Continue IV hydralazine when necessary  Sacral decubitus ulcer Appreciate recommendations.  Diet: Nothing by mouth  Code Status: DO NOT RESUSCITATE Family Communication: Wife and son at bedside Disposition Plan: Possibly residential hospice after family discussion for [place]   Consultants:  palliative  Procedures:  none  Antibiotics:  IV rocephin and azithro  HPI/Subjective: Seen and examined. Somnolent and poorly responsive. Non-verbal.  Objective: Filed Vitals:   02/25/15 1318  BP: 159/86  Pulse: 71  Temp: 99.5 F (37.5 C)  Resp: 20    Intake/Output Summary (Last 24 hours) at 02/25/15 1323 Last data filed at 02/25/15 0943  Gross per 24 hour  Intake   2800 ml  Output    325 ml  Net   2475 ml   Filed Weights   02/23/15 2019  Weight: 62.6 kg (138 lb 0.1 oz)    Exam:   General: Elderly male lying in bed sleepy,  fatigued and somnolent  HEENT: No pallor,dry  mucosa  Chest: Clear to auscultation bilaterally  CVS: NS1&S2  GI: soft, NT, ND  Musculoskeletal: Warm, no edema  CNS: Somnolent, poorly responsive and non verbal    Data Reviewed: Basic Metabolic Panel:  Recent Labs Lab 02/19/15 0915 02/23/15 1610 02/24/15 0420  NA 135 141 137  K 4.4 4.2 3.6  CL 104 109 110  CO2 19* 19* 22  GLUCOSE 100* 112* 113*  BUN 19 38* 27*  CREATININE 1.44* 1.32* 1.20  CALCIUM 8.8* 8.6* 7.7*   Liver Function Tests:  Recent Labs Lab 02/23/15 1610 02/24/15 0420  AST 28 20  ALT 19 16*  ALKPHOS 54 42  BILITOT 1.9* 1.7*  PROT 6.7 5.6*  ALBUMIN 3.0* 2.4*    Recent Labs Lab 02/23/15 1610  LIPASE 23    Recent Labs Lab 02/23/15 1610  AMMONIA 32   CBC:  Recent Labs Lab 02/19/15 0915 02/23/15 1610 02/24/15 0420  WBC 10.9* 8.9 7.6  NEUTROABS 8.9* 6.7  --   HGB 13.5 12.5* 10.6*  HCT 39.1 36.5* 31.1*  MCV 87.9 86.7 86.9  PLT 305 308 273   Cardiac Enzymes:  Recent Labs Lab 02/23/15 1610  TROPONINI 0.04*   BNP (last 3 results) No results for input(s): BNP in the last 8760 hours.  ProBNP (last 3 results) No results for input(s): PROBNP in the last 8760 hours.  CBG:  Recent Labs Lab 02/24/15 0737  GLUCAP 99    Recent Results (from the past 240 hour(s))  Urine culture     Status: None   Collection Time:  02/19/15 10:06 AM  Result Value Ref Range Status   Specimen Description URINE, RANDOM  Final   Special Requests NONE  Final   Culture   Final    MULTIPLE SPECIES PRESENT, SUGGEST RECOLLECTION IF CLINICALLY INDICATED   Report Status 02/20/2015 FINAL  Final  Urine culture     Status: None   Collection Time: 02/23/15  5:15 PM  Result Value Ref Range Status   Specimen Description URINE, RANDOM  Final   Special Requests NONE  Final   Culture NO GROWTH 2 DAYS  Final   Report Status 02/25/2015 FINAL  Final     Studies: Dg Chest Port 1 View  02/23/2015   CLINICAL  DATA:  Failure to thrive 8 months.  Increasing weakness.  EXAM: PORTABLE CHEST - 1 VIEW  COMPARISON:  02/19/2015  FINDINGS: Lungs are adequately inflated with persistent focal opacification over the lateral left base slightly worse and may be due to atelectasis versus infection. Minimal calcified plaque over the aortic arch. Cardiomediastinal silhouette and remainder of the exam is unchanged. Calcification versus contrast in the region of the left upper quadrant unchanged.  IMPRESSION: Focal opacification over the lateral left base slightly worse and may be due to atelectasis or infection.   Electronically Signed   By: Marin Olp M.D.   On: 02/23/2015 16:07    Scheduled Meds: . antiseptic oral rinse  7 mL Mouth Rinse BID  . aspirin EC  81 mg Oral Daily  . azithromycin  500 mg Intravenous Q24H  . cefTRIAXone (ROCEPHIN)  IV  1 g Intravenous Q24H  . dorzolamide-timolol  1 drop Both Eyes Daily  . enoxaparin (LOVENOX) injection  40 mg Subcutaneous Q24H  . lisinopril  20 mg Oral Daily   Continuous Infusions: . dextrose 5 % and 0.45 % NaCl with KCl 20 mEq/L 100 mL/hr at 02/25/15 0826      Time spent: 25 minutes    Rafia Shedden, [place]  [facility] Pager [phone]. If 7PM-7AM, please contact night-coverage at [email or web address], password [facility] 02/25/2015, 1:23 PM  LOS: 2 days

## 2015-02-26 NOTE — Clinical Social Work Note (Signed)
Clinical Social Work Assessment  Patient Details  Name: Brandon Graves MRN: [id number] Date of Birth: 06/24/1924  Date of referral:  02/26/15               Reason for consult:  Discharge Planning, Facility Placement, End of Life/Hospice                Permission sought to share information with:  Facility Sport and exercise psychologist, Family Supports Permission granted to share information::     Name::     Brandon Graves (wife)  Agency::  Hospices   Relationship::     Contact Information:     Housing/Transportation Living arrangements for the past 2 months:  Single Family Home Source of Information:  Spouse, Adult Children Patient Interpreter Needed:  None Criminal Activity/Legal Involvement Pertinent to Current Situation/Hospitalization:  No - Comment as needed Significant Relationships:  Adult Children, Spouse Lives with:  Spouse, Adult Children Do you feel safe going back to the place where you live?  No Need for family participation in patient care:  No (Coment)  Care giving concerns:  Patient's family feels that residential hospice placement is most appropriate for the patient at this time. The family has come to the decision of full comfort for the patient.    Social Worker assessment / plan:  CSW met with patient's family to complete assessment. Patient's family has made the difficult decision to make patient full comfort care. Patient has very supportive family including his wife, children, and grandchildren. Family specifically requests [facility] but they are open to other hospice facilities if no bed is available at [facility]. CSW has made appropriate referrals. CSW will followup with family in the morning re which facility will be able to accept the patient.  Employment status:  Disabled (Comment on whether or not currently receiving Disability) Insurance information:  Medicare PT Recommendations:  [place] / Referral to community resources:  Other  (Comment Required) (Residential Hospice referrals made)  Patient/Family's Response to care:  Patient's family appears to be coping well with the decisions they have been faced with. All family members are in agreement with decision for full comfort care. They have no complaints and appreciate the assistance of the care team here at [facility].  Patient/Family's Understanding of and Emotional Response to Diagnosis, Current Treatment, and Prognosis:  Patient's family understands that the patient has a poor prognosis and wishes for the patient to be made as comfortable as possible.  Emotional Assessment Appearance:  Appears stated age Attitude/Demeanor/Rapport:  Unable to Assess (Patient unable to communicate with CSW) Affect (typically observed):  Unable to Assess (Patient unable to communicate with CSW) Orientation:    Alcohol / Substance use:    Psych involvement (Current and /or in the community):  No (Comment)  Discharge Needs  Concerns to be addressed:  Discharge Planning Concerns Readmission within the last 30 days:  No Current discharge risk:  None Barriers to Discharge:  Hospice Bed not available (Bed will be available 7/22)   Liz Beach MSW, [place], [place], [phone]

## 2015-02-26 NOTE — Progress Notes (Addendum)
[facility] PROGRESS NOTE  Brandon Graves YWV:[id number] DOB: [birth year] DOA: 02/23/2015 PCP: Elby Showers, MD Brief narrative 81 year old male with history of macular degeneration, legally blind, hypertension with progressive failure to thrive presented to the ED with poor by mouth intake, weakness,couging upon swallowing. Blood will done in the ED showed left lower lobe infiltrate and mild worsened renal function.  Assessment/Plan: Community acquired pneumonia with severe failure to thrive Quality  life is extremely poor. patient is bedbound with severe deconditioning. D/c abx Swallowing evaluation and recommend nothing by mouth. Family understands patient's poor prognosis with progressive decline and interested in residential hospice after discussion with palliative care. Pt made comfort care. SW consulted.  Hypertension Medications held as patient is nothing by mouth. Continue IV hydralazine when necessary  Sacral decubitus ulcer Appreciate recommendations.  Diet: Nothing by mouth  Code Status: DO NOT RESUSCITATE Family Communication: Wife and son at bedside Disposition Plan: Possibly residential hospice after family discussion for [place]   Consultants:  Palliative care  Procedures:  none  Antibiotics:  IV rocephin and azithro  HPI/Subjective: Seen and examined. Febrile overnight. Mental status further worsened  Objective: Filed Vitals:   02/26/15 1323  BP: 109/63  Pulse: 62  Temp: 99.5 F (37.5 C)  Resp: 18    Intake/Output Summary (Last 24 hours) at 02/26/15 1448 Last data filed at 02/26/15 1300  Gross per 24 hour  Intake    800 ml  Output    350 ml  Net    450 ml   Filed Weights   02/23/15 2019  Weight: 62.6 kg (138 lb 0.1 oz)    Exam:   General: minimally responsive  HEENT: dry  mucosa  Chest: Clear to auscultation bilaterally  CVS: NS1&S2  GI: soft, ND   CNS: Somnolent, minimally  responsive     Data Reviewed: Basic  Metabolic Panel:  Recent Labs Lab 02/23/15 1610 02/24/15 0420  NA 141 137  K 4.2 3.6  CL 109 110  CO2 19* 22  GLUCOSE 112* 113*  BUN 38* 27*  CREATININE 1.32* 1.20  CALCIUM 8.6* 7.7*   Liver Function Tests:  Recent Labs Lab 02/23/15 1610 02/24/15 0420  AST 28 20  ALT 19 16*  ALKPHOS 54 42  BILITOT 1.9* 1.7*  PROT 6.7 5.6*  ALBUMIN 3.0* 2.4*    Recent Labs Lab 02/23/15 1610  LIPASE 23    Recent Labs Lab 02/23/15 1610  AMMONIA 32   CBC:  Recent Labs Lab 02/23/15 1610 02/24/15 0420  WBC 8.9 7.6  NEUTROABS 6.7  --   HGB 12.5* 10.6*  HCT 36.5* 31.1*  MCV 86.7 86.9  PLT 308 273   Cardiac Enzymes:  Recent Labs Lab 02/23/15 1610  TROPONINI 0.04*   BNP (last 3 results) No results for input(s): BNP in the last 8760 hours.  ProBNP (last 3 results) No results for input(s): PROBNP in the last 8760 hours.  CBG:  Recent Labs Lab 02/24/15 0737  GLUCAP 99    Recent Results (from the past 240 hour(s))  Urine culture     Status: None   Collection Time: 02/19/15 10:06 AM  Result Value Ref Range Status   Specimen Description URINE, RANDOM  Final   Special Requests NONE  Final   Culture   Final    MULTIPLE SPECIES PRESENT, SUGGEST RECOLLECTION IF CLINICALLY INDICATED   Report Status 02/20/2015 FINAL  Final  Urine culture     Status: None   Collection Time: 02/23/15  5:15 PM  Result Value Ref Range Status   Specimen Description URINE, RANDOM  Final   Special Requests NONE  Final   Culture NO GROWTH 2 DAYS  Final   Report Status 02/25/2015 FINAL  Final     Studies: No results found.  Scheduled Meds: . antiseptic oral rinse  7 mL Mouth Rinse BID  . aspirin EC  81 mg Oral Daily  . azithromycin  500 mg Intravenous Q24H  . cefTRIAXone (ROCEPHIN)  IV  1 g Intravenous Q24H  . dorzolamide-timolol  1 drop Both Eyes Daily  . enoxaparin (LOVENOX) injection  40 mg Subcutaneous Q24H  . lisinopril  20 mg Oral Daily   Continuous Infusions: . dextrose  5 % and 0.45 % NaCl with KCl 20 mEq/L 100 mL/hr at 02/26/15 2947      Time spent:15 minutes    Marlys Stegmaier, [place]  [facility] Pager [phone]. If 7PM-7AM, please contact night-coverage at [email or web address], password [facility] 02/26/2015, 2:48 PM  LOS: 3 days

## 2015-02-26 NOTE — Progress Notes (Signed)
02/26/2015  Based on today's notes it looks as though family is pursuing comfort measures.  PT tends to be a more aggressive measure and is not always pain free.  Residential hospice facility being set up.  PT to sign off.  Thanks,  Barbarann Ehlers. [place], [place], DPT [phone]

## 2015-02-26 NOTE — Clinical Social Work Note (Signed)
Patient will have bed at [facility] in AM. CSW has requested that MD prioritize patient's DC Summary in AM for an early transfer. [employer] assisting family. Report will be left for covering CSW.   Liz Beach MSW, [place], [place], [phone]

## 2015-02-26 NOTE — Plan of Care (Signed)
Problem: Phase I Progression Outcomes Goal: Hemodynamically stable Outcome: Not Progressing DNR, palliative care following, moving towards comfort care

## 2015-02-26 NOTE — Progress Notes (Signed)
Daily Progress Note   Patient Name: Brandon Graves       Date: 02/26/2015 DOB: Aug 01, 1924  Age: 81 y.o. MRN#: [id number] Attending Physician: Louellen Molder, MD Primary Care Physician: Elby Showers, MD Admit Date: 02/23/2015  Reason for Consultation/Follow-up: Establishing goals of care and Psychosocial/spiritual support  Subjective: Family meeting today with wife, June, sons, Gerald Stabs and Jenny Reichmann, daughter Neale Burly and husband Shanon Brow, and grand children Marjory Lies, and Myrene's daughters via phone.  Family talks about Brandon Graves work at [employer].  We talk about Brandon Graves not showing improvement and his wife states he is worse today and not able to wake.  We talk abot NPO status,  feeding tube options, and what Brandon Graves wishes are.  We talk about stopping antibiotics and other medications and providing pleasure feedings as Brandon Graves is able to take them.  We talk about end of life issues and they state they are ready for inpatient Hospice.  Mrs. Shepherd states they want him to be "as comfortable as possible".  We talk about their options and how to put this in place.  Family choice is to have [facility].  Family is encouraged that Hospice will be family centered and supportive of and knowledgeable about how to care for Brandon Graves.  We talk about prognosis and time.  Although he has not had complaints of pain, anxiety, or dyspnea, family is assured that if these were to develop that Brandon Graves will be given medication to combat these symptoms as needed.   Length of Stay: 3 days  Current Medications: Scheduled Meds:  . antiseptic oral rinse  7 mL Mouth Rinse BID  . aspirin EC  81 mg Oral Daily  . azithromycin  500 mg Intravenous Q24H  . cefTRIAXone (ROCEPHIN)  IV  1 g Intravenous Q24H  . dorzolamide-timolol  1 drop Both Eyes Daily  . enoxaparin (LOVENOX) injection  40 mg Subcutaneous Q24H  . lisinopril  20 mg Oral Daily    Continuous Infusions: .  dextrose 5 % and 0.45 % NaCl with KCl 20 mEq/L 100 mL/hr at 02/26/15 0623    PRN Meds: hydrALAZINE  Palliative Performance Scale:  3 months ago: 70% 2 weeks ago: 50% Today: 10% at this time.     Vital Signs: BP 109/63 mmHg  Pulse 62  Temp(Src) 99.5 F (37.5 C) (Oral)  Resp 18  Ht 5\' 8"  (1.727 m)  Wt 62.6 kg (138 lb 0.1 oz)  BMI 20.99 kg/m2  SpO2 95% SpO2: SpO2: 95 % O2 Device: O2 Device: Not Delivered O2 Flow Rate:    Intake/output summary:  Intake/Output Summary (Last 24 hours) at 02/26/15 1446 Last data filed at 02/26/15 1300  Gross per 24 hour  Intake    800 ml  Output    350 ml  Net    450 ml   LBM:   Baseline Weight: Weight: 62.6 kg (138 lb 0.1 oz) Most recent weight: Weight: 62.6 kg (138 lb 0.1 oz)          Additional Data Reviewed: Recent Labs     02/23/15  1610  02/24/15  0420  WBC  8.9  7.6  HGB  12.5*  10.6*  PLT  308  273  NA  141  137  BUN  38*  27*  CREATININE  1.32*  1.20     Problem List:  Patient Active Problem List   Diagnosis Date Noted  . Pressure ulcer 02/25/2015  . Palliative  care encounter   . DNR (do not resuscitate) discussion   . CAP (community acquired pneumonia) 02/23/2015  . FTT (failure to thrive) in adult 02/23/2015  . Blindness 02/23/2015  . Failure to thrive in adult   . Hearing loss 06/20/2013  . Essential hypertension, benign 10/13/2012  . 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 Pituitary mass 10/13/2012  . Osteoarthritis of left knee 10/13/2012  . Spinal stenosis in cervical region 10/13/2012  . Glaucoma 10/13/2012  . Macular degeneration of both eyes 10/13/2012  . Skin cancer of scalp 10/13/2012     Palliative Care Assessment & Plan    Code Status:  DNR  Goals of Care:  Residential Hospice. [facility] first choice.   Symptom Management:  No pain, anxiety, dyspnea noted at this time.   Prognosis: Hours - Days  Discharge Planning: Hospice facility, [facility]  requested.    Care plan was discussed with nursing, SW for Hospice placement, CM regarding current Advanced HH services and removal of hospital bed, and Dr. Clementeen Graham.   Thank you for allowing the Palliative Medicine Team to assist in the care of this patient.   Time In: 1400 Time Out: 1500 Total Time 60 minutes Prolonged Time Billed  yes     Greater than 50%  of this time was spent counseling and coordinating care related to the above assessment and plan.   Drue Novel, NP  02/26/2015, 2:46 PM  Please contact Palliative Medicine Team phone at [phone] for questions and concerns.

## 2015-02-26 NOTE — Progress Notes (Signed)
OT Cancellation Note  Patient Details Name: Brandon Graves MRN: [id number] DOB: 04-03-1924   Cancelled Treatment:    Reason Eval/Treat Not Completed: Other (comment). Palliative Care meeting scheduled for today at 2pm. According to PT's last cancel note, wife is requesting to hold therapy evals until after Palliative consult. Will check back as able and appropriate for OT eval/treat. Thank you for the order.   Barbarita Hutmacher , MS, OTR/L, CLT Pager: [phone]  02/26/2015, 1:15 PM

## 2015-02-27 NOTE — Progress Notes (Signed)
Report called to RN Sherri at [facility], whom verbalized all questions about the patient and his care were answered to her satisfaction. Awaiting EMS for transport, social worker [place] aware.

## 2015-02-27 NOTE — Progress Notes (Signed)
Utilization Review completed. Briceson Broadwater RN BSN CM 

## 2015-02-27 NOTE — Consult Note (Signed)
[facility] room available for [name] this morning. Spouse completed registration paper work yesterday. Dr. Orpah Melter to assume care per family request.   Please fax discharge summary to [phone].  RN please call report to [phone].  Please arrange transport for as early in day as possible.   Thank you.  Brandon Graves, [place]

## 2015-02-27 NOTE — Progress Notes (Signed)
NURSING PROGRESS NOTE  Brandon Graves [id number] Discharge Data: 02/27/2015 12:02 PM Attending Provider: Louellen Molder, MD UYE:[id number],YSHU J, MD   Donald Pore to be D/C'd to [facility] per MD order via EMS. All paperwork given to EMS staff.     All IV's will be discontinued and monitored for bleeding.  All belongings will be returned to patient for patient to take home.  Last Documented Vital Signs:  Blood pressure 151/95, pulse 91, temperature 97.9 F (36.6 C), temperature source Oral, resp. rate 18, height 5\' 8"  (1.727 m), weight 62.6 kg (138 lb 0.1 oz), SpO2 98 %.  Hendricks Limes RN, BS, BSN

## 2015-02-27 NOTE — Clinical Social Work Note (Signed)
Clinical Social Worker facilitated patient discharge including contacting patient family and facility to confirm patient discharge plans.  Clinical information faxed to facility and family agreeable with plan.  CSW arranged ambulance transport via [facility] to [facility].  RN to call report prior to discharge.  Clinical Social Worker will sign off for now as social work intervention is no longer needed. Please consult us again if new need arises.  Jesse Yadriel Kerrigan, LCSW [phone] 

## 2015-02-27 NOTE — Discharge Summary (Addendum)
Physician Discharge Summary  Brandon Graves GYK:[id number] DOB: 1924-06-16 DOA: 02/23/2015  PCP: Elby Showers, MD  Admit date: 02/23/2015 Discharge date: 02/27/2015  Time spent:  30 minutes  Recommendations for Outpatient Follow-up:  1. Discharged to residential hospice  Discharge Diagnoses:  Principal Problem:   CAP (community acquired pneumonia)   Active Problems:   FTT (failure to thrive) in adult   Essential hypertension, benign   Spinal stenosis in cervical region   Macular degeneration of both eyes   Hearing loss   Blindness   Pressure ulcer   Palliative care encounter   DNR (do not resuscitate) discussion  CODE STATUS: DO NOT RESUSCITATE  Discharge Condition: Guarded Diet: Nothing by mouth. Comfort feeds as needed.  Filed Weights   02/23/15 2019  Weight: 62.6 kg (138 lb 0.1 oz)    History of present illness:   please refer to admission H&P for details, in brief,81 year old male with history of macular degeneration, legally blind, hypertension with progressive failure to thrive presented to the ED with poor by mouth intake, weakness,couging upon swallowing. Blood will done in the ED showed left lower lobe infiltrate and mild worsened renal function.  Hospital Course:  Community acquired pneumonia with severe failure to thrive Patient has severe failure to thrive. He is  bedbound with severe deconditioning. Antibiotics discontinued. Swallowing evaluation done and recommend nothing by mouth. Family understands patient's poor prognosis with progressive decline and had a detailed discussion with palliative care team. They want him to be fully comfortable and were interested in residential hospice . Pt made comfort care and referred to hospice facility. Patient is not in pain, anxiety or discomfort and thus has not required any medications.  Hypertension Medications discontinued  Sacral decubitus ulcer     Code Status: DO NOT RESUSCITATE Family  Communication: Discussed with wife and son. Disposition Plan:  residential hospice    Consultants:  Palliative care  Procedures:  none  Antibiotics:  IV rocephin and azithro    Discharge Exam: Filed Vitals:   02/27/15 0612  BP: 151/95  Pulse: 91  Temp: 97.9 F (36.6 C)  Resp: 18    General: Elderly male lying in bed, fatigued, not in distress, legally blind HEENT: No pallor, dry oral mucosa Chest: Clear to auscultation bilaterally CVS: Normal S1 and S2, no murmurs rub or gallop GI: Soft, nondistended, nontender, bowel sounds present Musculoskeletal musculoskeletal: Warm, no edema CNS: Awake but nonverbal and noncommunicative   Discharge Instructions    Current Discharge Medication List    STOP taking these medications     aspirin EC 81 MG tablet      dorzolamide-timolol (COSOPT) 22.3-6.8 MG/ML ophthalmic solution      lisinopril (PRINIVIL,ZESTRIL) 20 MG tablet      amLODipine (NORVASC) 5 MG tablet        No Known Allergies Follow-up Information    Please follow up.   Why:  at [facility]       The results of significant diagnostics from this hospitalization (including imaging, microbiology, ancillary and laboratory) are listed below for reference.    Significant Diagnostic Studies: Ct Head Wo Contrast  02/19/2015   CLINICAL DATA:  One week history of weakness in the legs.  EXAM: CT HEAD WITHOUT CONTRAST  TECHNIQUE: Contiguous axial images were obtained from the base of the skull through the vertex without intravenous contrast.  COMPARISON:  Brain MR dated 12/13/2011  FINDINGS: No mass lesion. No midline shift. No acute hemorrhage or hematoma. No extra-axial fluid collections.  No evidence of acute infarction. There is diffuse cerebral cortical atrophy with secondary ventricular dilatation. Chronic periventricular white matter lucency consistent with small vessel ischemic disease, unchanged. No osseous abnormalities.  IMPRESSION: No acute intracranial  abnormality. Stable atrophy. Minimal chronic small vessel ischemic disease.   Electronically Signed   By: Lorriane Shire M.D.   On: 02/19/2015 09:57   Dg Chest Port 1 View  02/23/2015   CLINICAL DATA:  Failure to thrive 8 months.  Increasing weakness.  EXAM: PORTABLE CHEST - 1 VIEW  COMPARISON:  02/19/2015  FINDINGS: Lungs are adequately inflated with persistent focal opacification over the lateral left base slightly worse and may be due to atelectasis versus infection. Minimal calcified plaque over the aortic arch. Cardiomediastinal silhouette and remainder of the exam is unchanged. Calcification versus contrast in the region of the left upper quadrant unchanged.  IMPRESSION: Focal opacification over the lateral left base slightly worse and may be due to atelectasis or infection.   Electronically Signed   By: Marin Olp M.D.   On: 02/23/2015 16:07   Dg Chest Portable 1 View  02/19/2015   CLINICAL DATA:  Chest congestion and wheezing.  Weakness.  EXAM: PORTABLE CHEST - 1 VIEW  COMPARISON:  None.  FINDINGS: There is minimal atelectasis at the left lung base laterally. The lungs are otherwise clear. Heart size and vascularity are normal. Calcification in the arch of the aorta. No osseous abnormality.  IMPRESSION: Minimal atelectasis at the left lung base.  Aortic atherosclerosis.   Electronically Signed   By: Lorriane Shire M.D.   On: 02/19/2015 09:27    Microbiology: Recent Results (from the past 240 hour(s))  Urine culture     Status: None   Collection Time: 02/19/15 10:06 AM  Result Value Ref Range Status   Specimen Description URINE, RANDOM  Final   Special Requests NONE  Final   Culture   Final    MULTIPLE SPECIES PRESENT, SUGGEST RECOLLECTION IF CLINICALLY INDICATED   Report Status 02/20/2015 FINAL  Final  Urine culture     Status: None   Collection Time: 02/23/15  5:15 PM  Result Value Ref Range Status   Specimen Description URINE, RANDOM  Final   Special Requests NONE  Final    Culture NO GROWTH 2 DAYS  Final   Report Status 02/25/2015 FINAL  Final     Labs: Basic Metabolic Panel:  Recent Labs Lab 02/23/15 1610 02/24/15 0420  NA 141 137  K 4.2 3.6  CL 109 110  CO2 19* 22  GLUCOSE 112* 113*  BUN 38* 27*  CREATININE 1.32* 1.20  CALCIUM 8.6* 7.7*   Liver Function Tests:  Recent Labs Lab 02/23/15 1610 02/24/15 0420  AST 28 20  ALT 19 16*  ALKPHOS 54 42  BILITOT 1.9* 1.7*  PROT 6.7 5.6*  ALBUMIN 3.0* 2.4*    Recent Labs Lab 02/23/15 1610  LIPASE 23    Recent Labs Lab 02/23/15 1610  AMMONIA 32   CBC:  Recent Labs Lab 02/23/15 1610 02/24/15 0420  WBC 8.9 7.6  NEUTROABS 6.7  --   HGB 12.5* 10.6*  HCT 36.5* 31.1*  MCV 86.7 86.9  PLT 308 273   Cardiac Enzymes:  Recent Labs Lab 02/23/15 1610  TROPONINI 0.04*   BNP: BNP (last 3 results) No results for input(s): BNP in the last 8760 hours.  ProBNP (last 3 results) No results for input(s): PROBNP in the last 8760 hours.  CBG:  Recent Labs Lab 02/24/15 [phone]  GLUCAP 99       Signed:  Arohi Salvatierra  [facility] 02/27/2015, 6:56 AM

## 2015-03-09 DEATH — deceased

## 2017-01-05 IMAGING — CT CT HEAD W/O CM
2 series · 16 of 30 positions shown, 20 images · non-contrast
Comparison: Brain MR dated 12/13/2011

CLINICAL DATA: One week history of weakness in the legs.

EXAM:
CT HEAD WITHOUT CONTRAST
TECHNIQUE: Contiguous axial images were obtained from the base of the skull
through the vertex without intravenous contrast.

[Series 201: head w/o, idose (1) · axial · non-contrast · 0.49mm/px · z∈[+60,+190]mm · 13 of 32 slices shown, 17 images]
[im 3/32  brain]
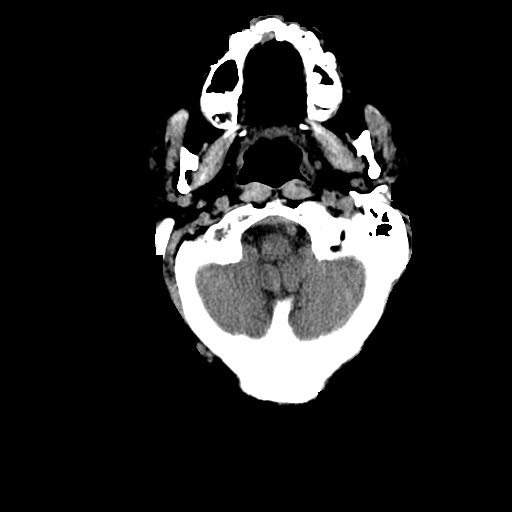
[im 3/32  bone]
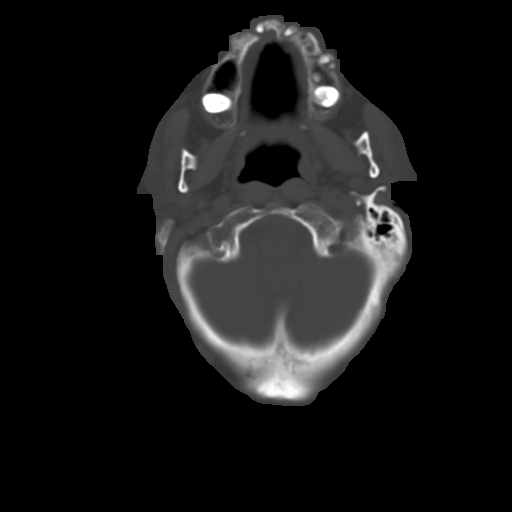
[im 5/32  brain]
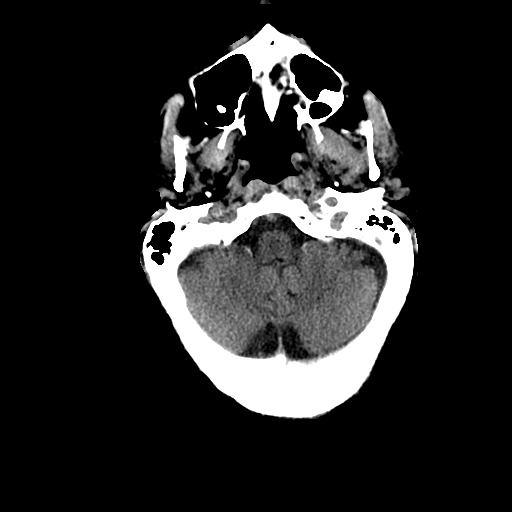
[im 7/32  brain]
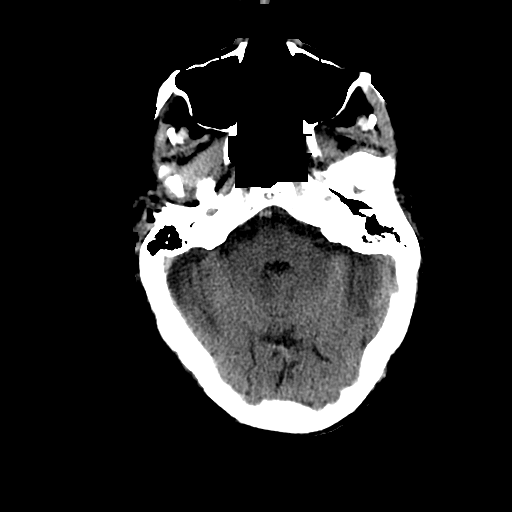
[im 9/32  brain]
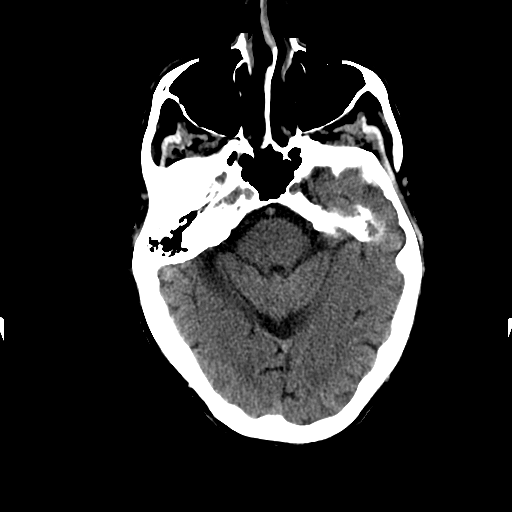
[im 12/32  brain]
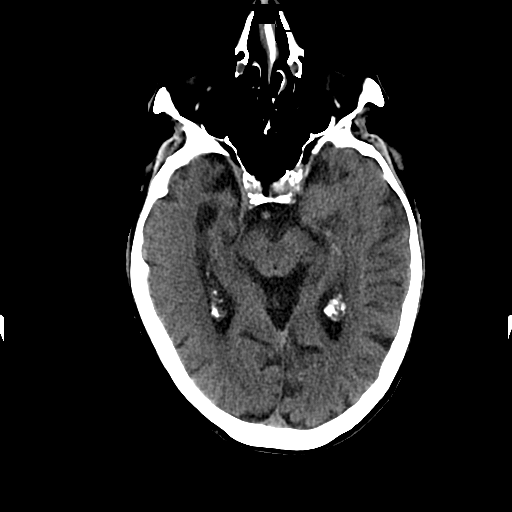
[im 12/32  bone]
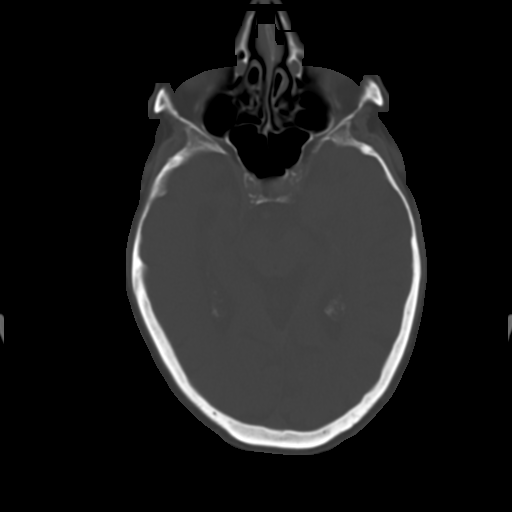
[im 14/32  brain]
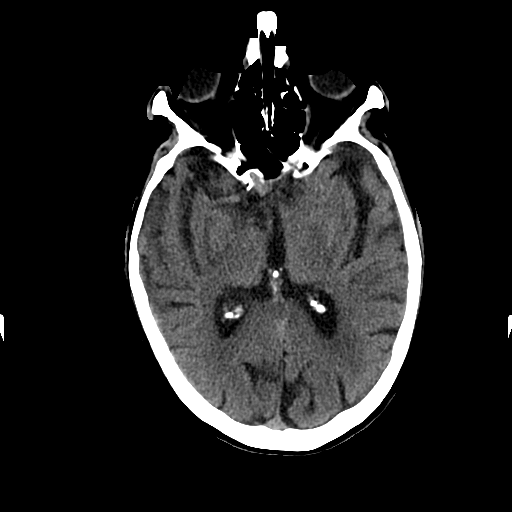
[im 16/32  brain]
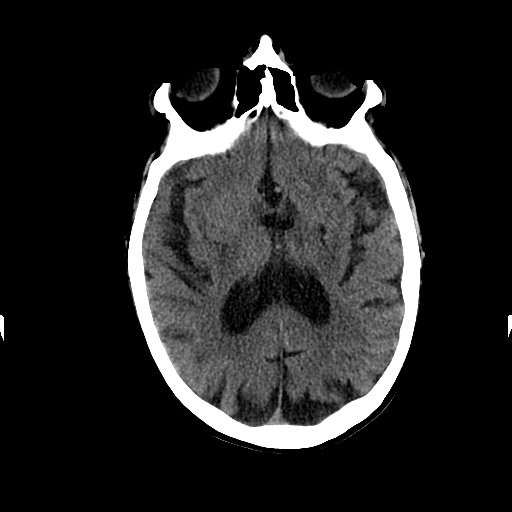
[im 18/32  brain]
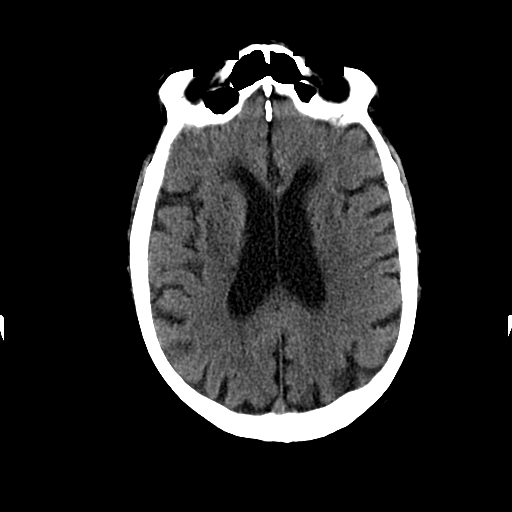
[im 20/32  brain]
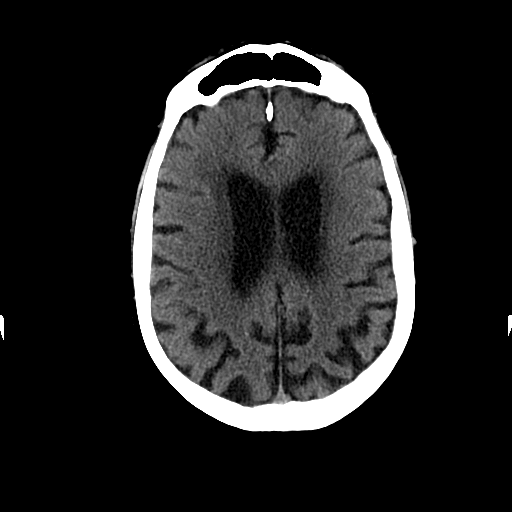
[im 20/32  bone]
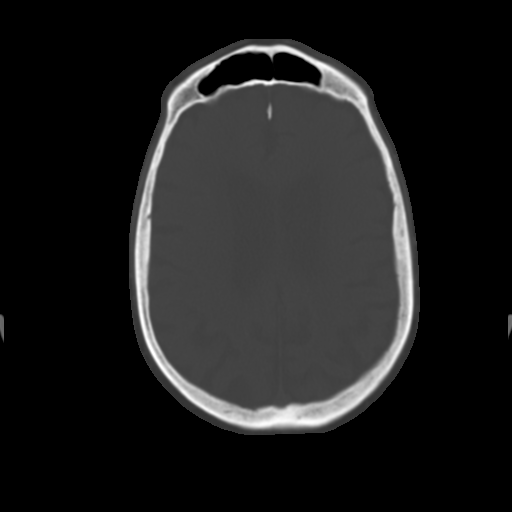
[im 23/32  brain]
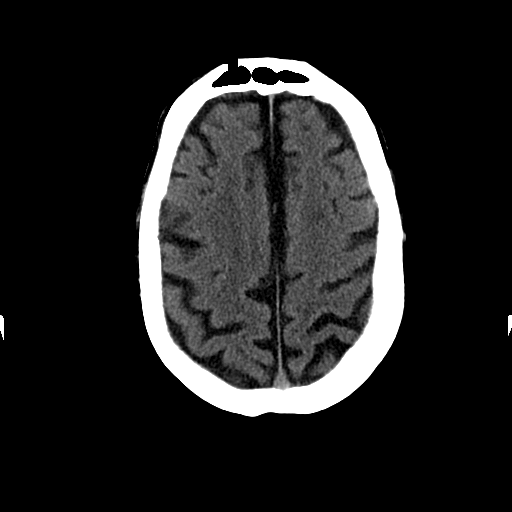
[im 25/32  brain]
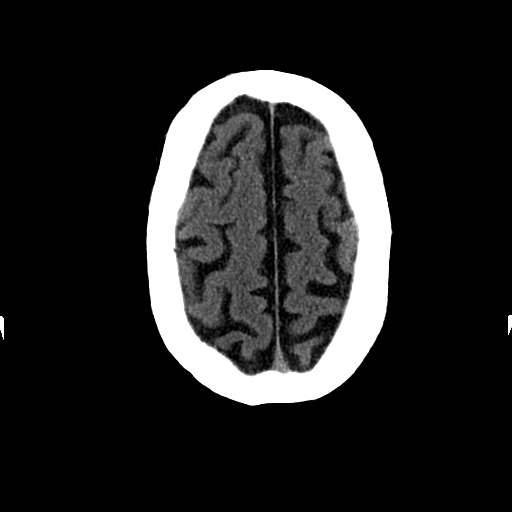
[im 27/32  brain]
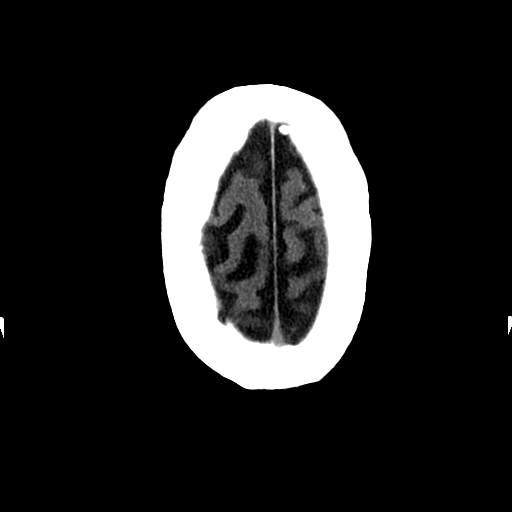
[im 29/32  brain]
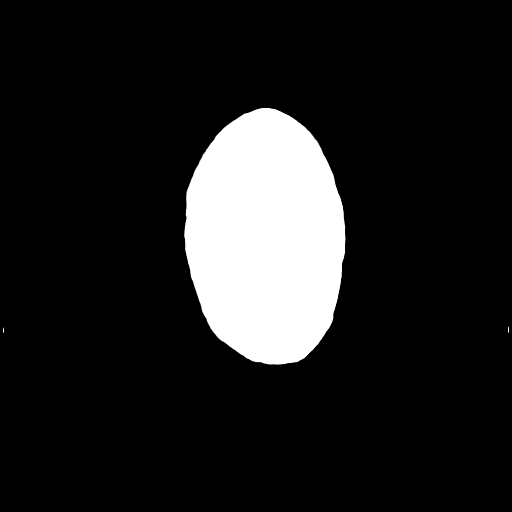
[im 29/32  bone]
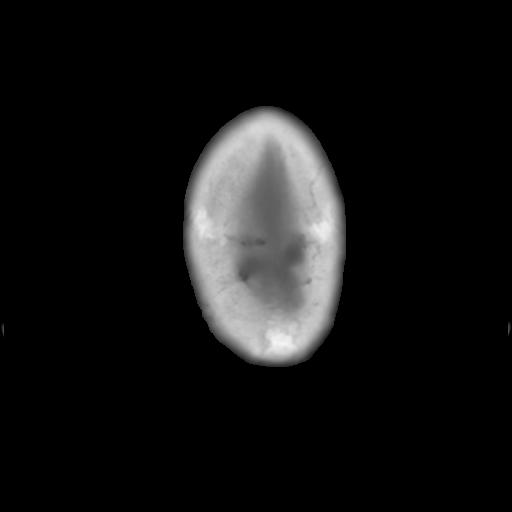

[Series 202: head w/o bone, idose (1) · axial · non-contrast · 0.49mm/px · z∈[+60,+105]mm · 3 of 32 slices shown]
[im 3/32  bone]
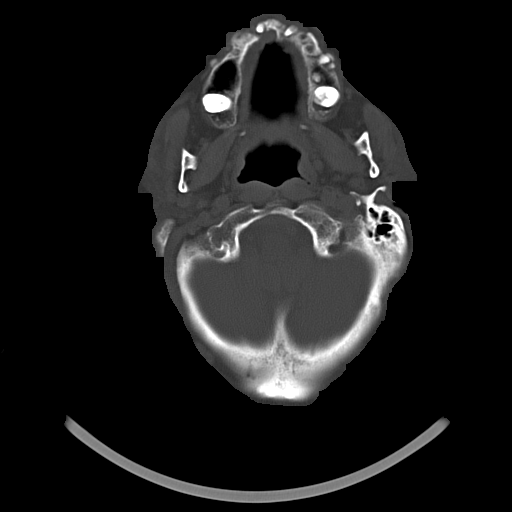
[im 7/32  bone]
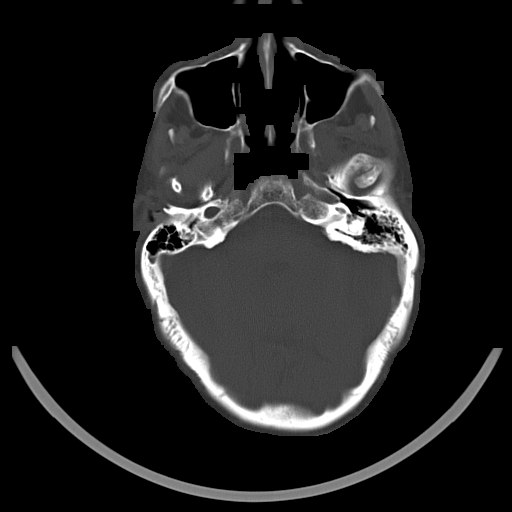
[im 12/32  bone]
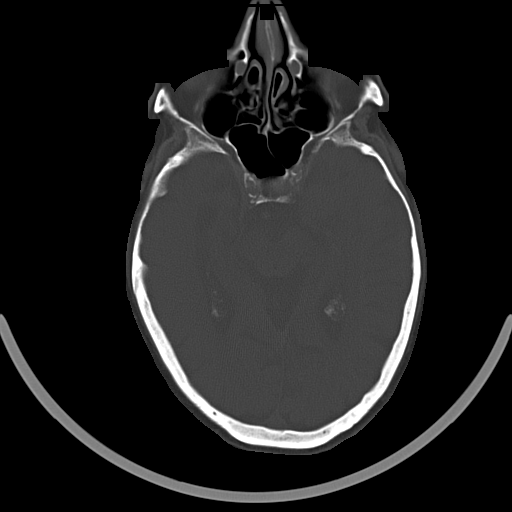

[16 of 30 positions shown; findings below may reference images not displayed]

FINDINGS: No mass lesion. No midline shift. No acute hemorrhage or hematoma.
No extra-axial fluid collections. No evidence of acute infarction.
There is diffuse cerebral cortical atrophy with secondary
ventricular dilatation. Chronic periventricular white matter lucency
consistent with small vessel ischemic disease, unchanged. No osseous
abnormalities.
IMPRESSION: No acute intracranial abnormality. Stable atrophy. Minimal chronic
small vessel ischemic disease.

## 2017-01-05 IMAGING — CR DG CHEST 1V PORT
1 series · 1 of 1 positions shown · non-contrast
Comparison: None.

CLINICAL DATA: Chest congestion and wheezing.  Weakness.

EXAM:
PORTABLE CHEST - 1 VIEW

[AP]
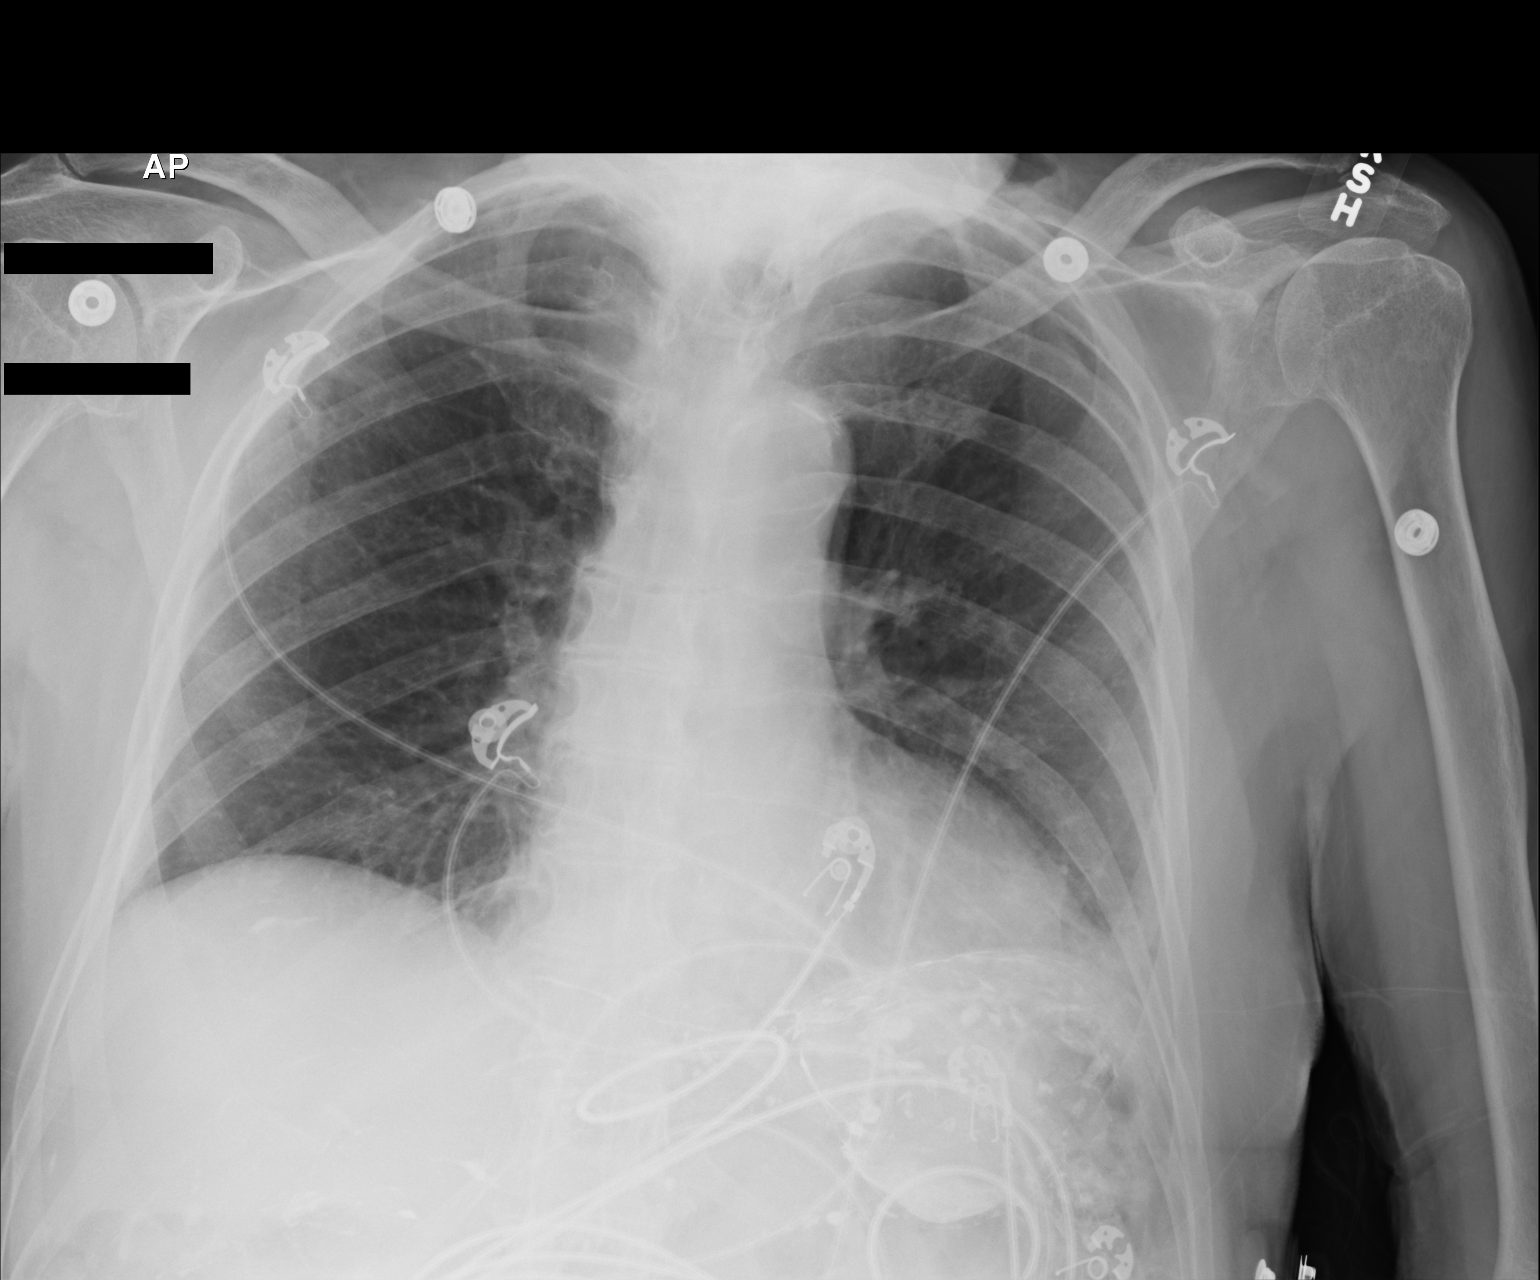

[1 of 1 positions shown; findings below may reference images not displayed]

FINDINGS: There is minimal atelectasis at the left lung base laterally. The
lungs are otherwise clear. Heart size and vascularity are normal.
Calcification in the arch of the aorta. No osseous abnormality.
IMPRESSION: Minimal atelectasis at the left lung base.  Aortic atherosclerosis.
